# Patient Record
Sex: Male | Born: 1950 | Race: Black or African American | Hispanic: No | Smoking: Former smoker
Health system: Southern US, Community
[De-identification: ages and names within clinical notes are randomized; demographics above are authoritative.]

## PROBLEM LIST (undated history)

## (undated) DIAGNOSIS — D649 Anemia, unspecified: Secondary | ICD-10-CM

## (undated) DIAGNOSIS — E119 Type 2 diabetes mellitus without complications: Secondary | ICD-10-CM

## (undated) DIAGNOSIS — I10 Essential (primary) hypertension: Secondary | ICD-10-CM

## (undated) DIAGNOSIS — K7460 Unspecified cirrhosis of liver: Secondary | ICD-10-CM

## (undated) DIAGNOSIS — F4310 Post-traumatic stress disorder, unspecified: Secondary | ICD-10-CM

## (undated) HISTORY — PX: CHOLECYSTECTOMY: SHX55

## (undated) HISTORY — PX: BACK SURGERY: SHX140

## (undated) HISTORY — PX: ANKLE SURGERY: SHX546

---

## 2015-02-05 ENCOUNTER — Encounter (HOSPITAL_COMMUNITY): Payer: Self-pay | Admitting: *Deleted

## 2015-02-05 ENCOUNTER — Emergency Department (HOSPITAL_COMMUNITY): Payer: Medicare HMO

## 2015-02-05 ENCOUNTER — Emergency Department (HOSPITAL_COMMUNITY)
Admission: EM | Admit: 2015-02-05 | Discharge: 2015-02-05 | Disposition: A | Discharge status: Home / Self Care | Payer: Medicare HMO | Attending: Emergency Medicine | Admitting: Emergency Medicine

## 2015-02-05 DIAGNOSIS — M62830 Muscle spasm of back: Secondary | ICD-10-CM

## 2015-02-05 DIAGNOSIS — Y92410 Unspecified street and highway as the place of occurrence of the external cause: Secondary | ICD-10-CM | POA: Insufficient documentation

## 2015-02-05 DIAGNOSIS — Z87891 Personal history of nicotine dependence: Secondary | ICD-10-CM | POA: Insufficient documentation

## 2015-02-05 DIAGNOSIS — Y998 Other external cause status: Secondary | ICD-10-CM | POA: Insufficient documentation

## 2015-02-05 DIAGNOSIS — S3992XA Unspecified injury of lower back, initial encounter: Secondary | ICD-10-CM | POA: Insufficient documentation

## 2015-02-05 DIAGNOSIS — S199XXA Unspecified injury of neck, initial encounter: Secondary | ICD-10-CM | POA: Diagnosis not present

## 2015-02-05 DIAGNOSIS — S8992XA Unspecified injury of left lower leg, initial encounter: Secondary | ICD-10-CM | POA: Diagnosis not present

## 2015-02-05 DIAGNOSIS — M545 Low back pain, unspecified: Secondary | ICD-10-CM

## 2015-02-05 DIAGNOSIS — E119 Type 2 diabetes mellitus without complications: Secondary | ICD-10-CM | POA: Diagnosis not present

## 2015-02-05 DIAGNOSIS — Y9389 Activity, other specified: Secondary | ICD-10-CM | POA: Insufficient documentation

## 2015-02-05 DIAGNOSIS — I10 Essential (primary) hypertension: Secondary | ICD-10-CM | POA: Diagnosis not present

## 2015-02-05 DIAGNOSIS — M79605 Pain in left leg: Secondary | ICD-10-CM

## 2015-02-05 DIAGNOSIS — S80812A Abrasion, left lower leg, initial encounter: Secondary | ICD-10-CM | POA: Insufficient documentation

## 2015-02-05 DIAGNOSIS — Z8659 Personal history of other mental and behavioral disorders: Secondary | ICD-10-CM | POA: Insufficient documentation

## 2015-02-05 HISTORY — DX: Type 2 diabetes mellitus without complications: E11.9

## 2015-02-05 HISTORY — DX: Essential (primary) hypertension: I10

## 2015-02-05 HISTORY — DX: Post-traumatic stress disorder, unspecified: F43.10

## 2015-02-05 MED ORDER — CYCLOBENZAPRINE HCL 10 MG PO TABS: 5.0000 mg | ORAL_TABLET | Freq: Three times a day (TID) | ORAL | Status: DC | PRN

## 2015-02-05 MED ORDER — HYDROCODONE-ACETAMINOPHEN 5-325 MG PO TABS
1.0000 | ORAL_TABLET | Freq: Once | ORAL | Status: AC
  Administered 2015-02-05: 1 via ORAL
  Filled 2015-02-05: qty 1

## 2015-02-05 MED ORDER — HYDROCODONE-ACETAMINOPHEN 5-325 MG PO TABS: 1.0000 | ORAL_TABLET | Freq: Four times a day (QID) | ORAL | Status: DC | PRN

## 2015-02-05 MED ORDER — NAPROXEN 500 MG PO TABS: 500.0000 mg | ORAL_TABLET | Freq: Two times a day (BID) | ORAL | Status: DC | PRN

## 2015-02-05 NOTE — ED Notes (Signed)
Patient gone to xray 
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
## 2015-02-09 ENCOUNTER — Emergency Department (HOSPITAL_COMMUNITY): Payer: Medicare HMO

## 2015-02-09 ENCOUNTER — Encounter (HOSPITAL_COMMUNITY): Payer: Self-pay | Admitting: Emergency Medicine

## 2015-02-09 ENCOUNTER — Inpatient Hospital Stay (HOSPITAL_COMMUNITY)
Admission: EM | Admit: 2015-02-09 | Discharge: 2015-02-12 | DRG: 442 | Disposition: A | Discharge status: Home / Self Care | Payer: Medicare HMO | Attending: Internal Medicine | Admitting: Internal Medicine

## 2015-02-09 DIAGNOSIS — Z9049 Acquired absence of other specified parts of digestive tract: Secondary | ICD-10-CM | POA: Diagnosis present

## 2015-02-09 DIAGNOSIS — R4182 Altered mental status, unspecified: Secondary | ICD-10-CM

## 2015-02-09 DIAGNOSIS — R339 Retention of urine, unspecified: Secondary | ICD-10-CM | POA: Diagnosis present

## 2015-02-09 DIAGNOSIS — E119 Type 2 diabetes mellitus without complications: Secondary | ICD-10-CM | POA: Diagnosis present

## 2015-02-09 DIAGNOSIS — F4310 Post-traumatic stress disorder, unspecified: Secondary | ICD-10-CM | POA: Diagnosis present

## 2015-02-09 DIAGNOSIS — K7290 Hepatic failure, unspecified without coma: Principal | ICD-10-CM | POA: Diagnosis present

## 2015-02-09 DIAGNOSIS — D696 Thrombocytopenia, unspecified: Secondary | ICD-10-CM | POA: Diagnosis present

## 2015-02-09 DIAGNOSIS — F1290 Cannabis use, unspecified, uncomplicated: Secondary | ICD-10-CM | POA: Diagnosis present

## 2015-02-09 DIAGNOSIS — E869 Volume depletion, unspecified: Secondary | ICD-10-CM | POA: Diagnosis present

## 2015-02-09 DIAGNOSIS — K7682 Hepatic encephalopathy: Secondary | ICD-10-CM

## 2015-02-09 DIAGNOSIS — Z87891 Personal history of nicotine dependence: Secondary | ICD-10-CM

## 2015-02-09 DIAGNOSIS — I10 Essential (primary) hypertension: Secondary | ICD-10-CM | POA: Diagnosis present

## 2015-02-09 DIAGNOSIS — E7220 Disorder of urea cycle metabolism, unspecified: Secondary | ICD-10-CM | POA: Diagnosis present

## 2015-02-09 DIAGNOSIS — N179 Acute kidney failure, unspecified: Secondary | ICD-10-CM

## 2015-02-09 DIAGNOSIS — K7460 Unspecified cirrhosis of liver: Secondary | ICD-10-CM

## 2015-02-09 DIAGNOSIS — D649 Anemia, unspecified: Secondary | ICD-10-CM

## 2015-02-09 HISTORY — DX: Anemia, unspecified: D64.9

## 2015-02-09 HISTORY — DX: Unspecified cirrhosis of liver: K74.60

## 2015-02-09 LAB — COMPREHENSIVE METABOLIC PANEL
ALT: 30 U/L (ref 17–63)
ANION GAP: 9 (ref 5–15)
AST: 84 U/L — ABNORMAL HIGH (ref 15–41)
Albumin: 3 g/dL — ABNORMAL LOW (ref 3.5–5.0)
Alkaline Phosphatase: 251 U/L — ABNORMAL HIGH (ref 38–126)
BUN: 41 mg/dL — ABNORMAL HIGH (ref 6–20)
CO2: 21 mmol/L — AB (ref 22–32)
CREATININE: 2.04 mg/dL — AB (ref 0.61–1.24)
Calcium: 9.5 mg/dL (ref 8.9–10.3)
Chloride: 108 mmol/L (ref 101–111)
GFR calc Af Amer: 38 mL/min — ABNORMAL LOW (ref >60)
GFR calc non Af Amer: 33 mL/min — ABNORMAL LOW (ref >60)
GLUCOSE: 114 mg/dL — AB (ref 65–99)
Potassium: 4.9 mmol/L (ref 3.5–5.1)
Sodium: 138 mmol/L (ref 135–145)
Total Bilirubin: 1.3 mg/dL — ABNORMAL HIGH (ref 0.3–1.2)
Total Protein: 8.2 g/dL — ABNORMAL HIGH (ref 6.5–8.1)

## 2015-02-09 LAB — RAPID URINE DRUG SCREEN, HOSP PERFORMED
Amphetamines: NOT DETECTED
BARBITURATES: NOT DETECTED
Benzodiazepines: NOT DETECTED
COCAINE: NOT DETECTED
Opiates: NOT DETECTED
TETRAHYDROCANNABINOL: NOT DETECTED

## 2015-02-09 LAB — URINALYSIS, ROUTINE W REFLEX MICROSCOPIC
Bilirubin Urine: NEGATIVE
Glucose, UA: NEGATIVE mg/dL
Hgb urine dipstick: NEGATIVE
KETONES UR: NEGATIVE mg/dL
Leukocytes, UA: NEGATIVE
Nitrite: NEGATIVE
PH: 7.5 (ref 5.0–8.0)
Protein, ur: NEGATIVE mg/dL
SPECIFIC GRAVITY, URINE: 1.014 (ref 1.005–1.030)
Urobilinogen, UA: 4 mg/dL — ABNORMAL HIGH (ref 0.0–1.0)

## 2015-02-09 LAB — CBG MONITORING, ED: GLUCOSE-CAPILLARY: 87 mg/dL (ref 65–99)

## 2015-02-09 LAB — ETHANOL: Alcohol, Ethyl (B): <5 mg/dL (ref <5)

## 2015-02-09 LAB — AMMONIA: Ammonia: 181 umol/L — ABNORMAL HIGH (ref 9–35)

## 2015-02-09 MED ORDER — NALOXONE HCL 0.4 MG/ML IJ SOLN
0.4000 mg | Freq: Once | INTRAMUSCULAR | Status: AC
  Administered 2015-02-09: 0.4 mg via INTRAVENOUS
  Filled 2015-02-09: qty 1
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
## 2015-02-10 ENCOUNTER — Inpatient Hospital Stay (HOSPITAL_COMMUNITY): Payer: Medicare HMO

## 2015-02-10 ENCOUNTER — Encounter (HOSPITAL_COMMUNITY): Payer: Self-pay | Admitting: Internal Medicine

## 2015-02-10 DIAGNOSIS — R4182 Altered mental status, unspecified: Secondary | ICD-10-CM

## 2015-02-10 DIAGNOSIS — K7290 Hepatic failure, unspecified without coma: Secondary | ICD-10-CM | POA: Diagnosis present

## 2015-02-10 DIAGNOSIS — E119 Type 2 diabetes mellitus without complications: Secondary | ICD-10-CM | POA: Diagnosis present

## 2015-02-10 DIAGNOSIS — D649 Anemia, unspecified: Secondary | ICD-10-CM | POA: Diagnosis not present

## 2015-02-10 DIAGNOSIS — D696 Thrombocytopenia, unspecified: Secondary | ICD-10-CM | POA: Diagnosis present

## 2015-02-10 DIAGNOSIS — R339 Retention of urine, unspecified: Secondary | ICD-10-CM | POA: Diagnosis present

## 2015-02-10 DIAGNOSIS — Z87891 Personal history of nicotine dependence: Secondary | ICD-10-CM | POA: Diagnosis not present

## 2015-02-10 DIAGNOSIS — E869 Volume depletion, unspecified: Secondary | ICD-10-CM | POA: Diagnosis present

## 2015-02-10 DIAGNOSIS — K7460 Unspecified cirrhosis of liver: Secondary | ICD-10-CM | POA: Diagnosis present

## 2015-02-10 DIAGNOSIS — N179 Acute kidney failure, unspecified: Secondary | ICD-10-CM | POA: Diagnosis present

## 2015-02-10 DIAGNOSIS — E7220 Disorder of urea cycle metabolism, unspecified: Secondary | ICD-10-CM | POA: Diagnosis present

## 2015-02-10 DIAGNOSIS — Z9049 Acquired absence of other specified parts of digestive tract: Secondary | ICD-10-CM | POA: Diagnosis present

## 2015-02-10 DIAGNOSIS — F1290 Cannabis use, unspecified, uncomplicated: Secondary | ICD-10-CM | POA: Diagnosis present

## 2015-02-10 DIAGNOSIS — I10 Essential (primary) hypertension: Secondary | ICD-10-CM | POA: Diagnosis present

## 2015-02-10 DIAGNOSIS — F4310 Post-traumatic stress disorder, unspecified: Secondary | ICD-10-CM | POA: Diagnosis present

## 2015-02-10 LAB — COMPREHENSIVE METABOLIC PANEL
ALBUMIN: 2.9 g/dL — AB (ref 3.5–5.0)
ALT: 30 U/L (ref 17–63)
AST: 80 U/L — ABNORMAL HIGH (ref 15–41)
Alkaline Phosphatase: 222 U/L — ABNORMAL HIGH (ref 38–126)
Anion gap: 12 (ref 5–15)
BUN: 35 mg/dL — AB (ref 6–20)
CHLORIDE: 110 mmol/L (ref 101–111)
CO2: 17 mmol/L — ABNORMAL LOW (ref 22–32)
Calcium: 9.4 mg/dL (ref 8.9–10.3)
Creatinine, Ser: 1.78 mg/dL — ABNORMAL HIGH (ref 0.61–1.24)
GFR, EST AFRICAN AMERICAN: 45 mL/min — AB (ref >60)
GFR, EST NON AFRICAN AMERICAN: 39 mL/min — AB (ref >60)
Glucose, Bld: 125 mg/dL — ABNORMAL HIGH (ref 65–99)
Potassium: 4.4 mmol/L (ref 3.5–5.1)
Sodium: 139 mmol/L (ref 135–145)
Total Bilirubin: 1.7 mg/dL — ABNORMAL HIGH (ref 0.3–1.2)
Total Protein: 8.2 g/dL — ABNORMAL HIGH (ref 6.5–8.1)

## 2015-02-10 LAB — AMMONIA: Ammonia: 90 umol/L — ABNORMAL HIGH (ref 9–35)

## 2015-02-10 LAB — GLUCOSE, CAPILLARY
GLUCOSE-CAPILLARY: 103 mg/dL — AB (ref 65–99)
GLUCOSE-CAPILLARY: 117 mg/dL — AB (ref 65–99)
GLUCOSE-CAPILLARY: 82 mg/dL (ref 65–99)
Glucose-Capillary: 111 mg/dL — ABNORMAL HIGH (ref 65–99)
Glucose-Capillary: 122 mg/dL — ABNORMAL HIGH (ref 65–99)

## 2015-02-10 LAB — CBC WITH DIFFERENTIAL/PLATELET
BASOS PCT: 0 % (ref 0–1)
Basophils Absolute: 0 10*3/uL (ref 0.0–0.1)
EOS PCT: 1 % (ref 0–5)
Eosinophils Absolute: 0.1 10*3/uL (ref 0.0–0.7)
HCT: 33.3 % — ABNORMAL LOW (ref 39.0–52.0)
HEMOGLOBIN: 11.7 g/dL — AB (ref 13.0–17.0)
LYMPHS PCT: 27 % (ref 12–46)
Lymphs Abs: 1.6 10*3/uL (ref 0.7–4.0)
MCH: 32.5 pg (ref 26.0–34.0)
MCHC: 35.1 g/dL (ref 30.0–36.0)
MCV: 92.5 fL (ref 78.0–100.0)
Monocytes Absolute: 1 10*3/uL (ref 0.1–1.0)
Monocytes Relative: 17 % — ABNORMAL HIGH (ref 3–12)
NEUTROS PCT: 55 % (ref 43–77)
Neutro Abs: 3.2 10*3/uL (ref 1.7–7.7)
Platelets: 106 10*3/uL — ABNORMAL LOW (ref 150–400)
RBC: 3.6 MIL/uL — ABNORMAL LOW (ref 4.22–5.81)
RDW: 13.5 % (ref 11.5–15.5)
WBC: 5.9 10*3/uL (ref 4.0–10.5)

## 2015-02-10 LAB — CBC
HEMATOCRIT: 34.3 % — AB (ref 39.0–52.0)
Hemoglobin: 12 g/dL — ABNORMAL LOW (ref 13.0–17.0)
MCH: 32.4 pg (ref 26.0–34.0)
MCHC: 35 g/dL (ref 30.0–36.0)
MCV: 92.7 fL (ref 78.0–100.0)
Platelets: 101 10*3/uL — ABNORMAL LOW (ref 150–400)
RBC: 3.7 MIL/uL — ABNORMAL LOW (ref 4.22–5.81)
RDW: 13.8 % (ref 11.5–15.5)
WBC: 6 10*3/uL (ref 4.0–10.5)

## 2015-02-10 LAB — TSH: TSH: 1.342 u[IU]/mL (ref 0.350–4.500)

## 2015-02-10 LAB — PROTIME-INR
INR: 1.36 (ref 0.00–1.49)
Prothrombin Time: 16.8 seconds — ABNORMAL HIGH (ref 11.6–15.2)

## 2015-02-10 LAB — SODIUM, URINE, RANDOM: Sodium, Ur: 144 mmol/L

## 2015-02-10 LAB — MRSA PCR SCREENING: MRSA by PCR: NEGATIVE

## 2015-02-10 MED ORDER — LACTULOSE 10 GM/15ML PO SOLN
20.0000 g | ORAL | Status: DC
  Administered 2015-02-10 – 2015-02-12 (×12): 20 g via ORAL
  Filled 2015-02-10 (×18): qty 30

## 2015-02-10 MED ORDER — LACTULOSE ENEMA
300.0000 mL | Freq: Once | ORAL | Status: AC
  Administered 2015-02-10: 300 mL via RECTAL
  Filled 2015-02-10: qty 300

## 2015-02-10 MED ORDER — LACTULOSE 10 GM/15ML PO SOLN
20.0000 g | ORAL | Status: DC
  Filled 2015-02-10 (×7): qty 30

## 2015-02-10 MED ORDER — LABETALOL HCL 5 MG/ML IV SOLN
10.0000 mg | Freq: Once | INTRAVENOUS | Status: AC
  Administered 2015-02-10: 10 mg via INTRAVENOUS
  Filled 2015-02-10: qty 4

## 2015-02-10 MED ORDER — SODIUM CHLORIDE 0.9 % IV SOLN
INTRAVENOUS | Status: AC
  Administered 2015-02-10 (×3): via INTRAVENOUS

## 2015-02-10 MED ORDER — AMLODIPINE BESYLATE 10 MG PO TABS
10.0000 mg | ORAL_TABLET | Freq: Every day | ORAL | Status: DC
  Administered 2015-02-10 – 2015-02-12 (×3): 10 mg via ORAL
  Filled 2015-02-10 (×3): qty 1

## 2015-02-10 MED ORDER — GABAPENTIN 300 MG PO CAPS
300.0000 mg | ORAL_CAPSULE | Freq: Two times a day (BID) | ORAL | Status: DC
Start: 2015-02-10 — End: 2015-02-12
  Administered 2015-02-10 – 2015-02-12 (×5): 300 mg via ORAL
  Filled 2015-02-10 (×6): qty 1

## 2015-02-10 MED ORDER — TRAMADOL HCL 50 MG PO TABS
50.0000 mg | ORAL_TABLET | Freq: Four times a day (QID) | ORAL | Status: DC | PRN
  Administered 2015-02-10 – 2015-02-12 (×3): 50 mg via ORAL
  Filled 2015-02-10 (×3): qty 1

## 2015-02-10 MED ORDER — TRAZODONE HCL 50 MG PO TABS
50.0000 mg | ORAL_TABLET | Freq: Every day | ORAL | Status: DC
  Administered 2015-02-10 – 2015-02-11 (×2): 50 mg via ORAL
  Filled 2015-02-10 (×3): qty 1

## 2015-02-10 MED ORDER — INSULIN ASPART 100 UNIT/ML ~~LOC~~ SOLN
0.0000 [IU] | Freq: Three times a day (TID) | SUBCUTANEOUS | Status: DC
  Administered 2015-02-11: 1 [IU] via SUBCUTANEOUS
  Administered 2015-02-11: 2 [IU] via SUBCUTANEOUS
  Administered 2015-02-11: 1 [IU] via SUBCUTANEOUS
  Administered 2015-02-12: 5 [IU] via SUBCUTANEOUS
  Administered 2015-02-12: 1 [IU] via SUBCUTANEOUS

## 2015-02-10 MED ORDER — HYPROMELLOSE (GONIOSCOPIC) 2.5 % OP SOLN: 1.0000 [drp] | Freq: Four times a day (QID) | OPHTHALMIC | Status: DC | PRN

## 2015-02-10 MED ORDER — SODIUM CHLORIDE 0.9 % IJ SOLN
3.0000 mL | Freq: Two times a day (BID) | INTRAMUSCULAR | Status: DC
  Administered 2015-02-10 – 2015-02-12 (×4): 3 mL via INTRAVENOUS

## 2015-02-10 MED ORDER — SERTRALINE HCL 25 MG PO TABS
25.0000 mg | ORAL_TABLET | Freq: Every day | ORAL | Status: DC
Start: 2015-02-10 — End: 2015-02-12
  Administered 2015-02-10 – 2015-02-12 (×3): 25 mg via ORAL
  Filled 2015-02-10 (×3): qty 1

## 2015-02-10 MED ORDER — HYDRALAZINE HCL 20 MG/ML IJ SOLN
10.0000 mg | Freq: Four times a day (QID) | INTRAMUSCULAR | Status: DC | PRN
  Administered 2015-02-10 – 2015-02-11 (×4): 10 mg via INTRAVENOUS
  Filled 2015-02-10 (×4): qty 1

## 2015-02-10 MED ORDER — LACTULOSE ENEMA
300.0000 mL | Freq: Once | RECTAL | Status: AC
Start: 2015-02-10 — End: 2015-02-10
  Administered 2015-02-10: 300 mL via RECTAL
  Filled 2015-02-10: qty 300

## 2015-02-10 NOTE — Progress Notes (Signed)
Pt complains of headache. Craige CottaKirby NP paged. Pt also complains of blurred vision, which he stated might be related to him not having his glasses from home.  Craige CottaKirby NP placed order for Tylenol 650mg  PO. Will carry out order.

## 2015-02-10 NOTE — Progress Notes (Signed)
Patient ok to travel to ultrasound off telemetry and without RN per MD.

## 2015-02-10 NOTE — Progress Notes (Signed)
NURSING PROGRESS NOTE  Jermaine Alexander [id number] Transfer Data: 02/10/2015 5:57 PM Attending Provider: Leatha Gildingostin M Gherghe, MD WGN:FAOZHYQMPCP:PROVIDER NOT IN SYSTEM Code Status: FULL  Jermaine Alexander is a 64 y.o. male patient transferred from 23M, alert and oriented x4. -No acute distress noted.  -No complaints of shortness of breath.  -No complaints of chest pain.   Cardiac Monitoring:   Blood pressure 153/67, pulse 95, temperature 98.8 F (37.1 C), temperature source Oral, resp. rate 18, height 6\' 2"  (1.88 m), weight 84.687 kg (186 lb 11.2 oz), SpO2 100 %.    Allergies:  Review of patient's allergies indicates no known allergies.  Past Medical History:   has a past medical history of Diabetes mellitus without complication; Hypertension; PTSD (post-traumatic stress disorder); Cirrhosis; and Anemia.  Past Surgical History:   has past surgical history that includes Cholecystectomy; Ankle surgery; and Back surgery.  Social History:   reports that he has quit smoking. He does not have any smokeless tobacco history on file. He reports that he uses illicit drugs (Marijuana). He reports that he does not drink alcohol.  Skin: intact  Patient/Family orientated to room. Information packet given to patient/family. Admission inpatient armband information verified with patient/family to include name and date of birth and placed on patient arm. Side rails up x 2, fall assessment and education completed with patient/family. Patient/family able to verbalize understanding of risk associated with falls and verbalized understanding to call for assistance before getting out of bed. Call light within reach. Patient/family able to voice and demonstrate understanding of unit orientation instructions.    Will continue to evaluate and treat per MD orders.

## 2015-02-10 NOTE — Care Management Note (Addendum)
Case Management Note  Patient Details  Name: Jermaine Alexander MRN: [id number] Date of Birth: May 18, 1951  Subjective/Objective:      [place] now living with his daughter Jermaine Alexander.  [facility] [place]  [facility] and to call back to see if I need to notify the [facility] with numbers to call.  Jermaine Alexander's number is [phone]  [facility] called back - patient is not service related and has [employer] insurance primary and Medicare part C and D, which covers his hospitalization here.  Is a patient of [facility].  Dr.  Alphonsa GinVaidya, SW Jermaine Alexander at [phone], ext [phone].                Action/Plan:   Expected Discharge Date:                  Expected Discharge Plan:  Home/Self Care  In-House Referral:     Discharge planning Services     Post Acute Care Choice:    Choice offered to:     DME Arranged:    DME Agency:     HH Arranged:    HH Agency:     Status of Service:  In process, will continue to follow  Medicare Important Message Given:    Date Medicare IM Given:    Medicare IM give by:    Date Additional Medicare IM Given:    Additional Medicare Important Message give by:     If discussed at Long Length of Stay Meetings, dates discussed:    Additional Comments:  Vangie BickerBrown, Tekelia Kareem Jane, RN 02/10/2015, 2:33 PM

## 2015-02-10 NOTE — Progress Notes (Signed)
Patient seen and examined this morning, admitted overnight with hepatic encephalopathy. 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  He wakes up when prompted, appears sleepy, is oriented to person   Hepatic encephalopathy - I am not sure whether this is his first episode or not, does not appear to be on Lactulose or Rifaximin at home - lactulose enema x 3 so far, waking up more, will continue po - abdominal US without ascites  Liver cirrhosis - MELD 17 this morning - decompensated with evidence of HE  Elevated LFTS - due to cirrhosis  Thrombocytopenia / anemia - in the setting of cirrhosis  AKI - unknown baseline renal function - no evidence of fluid overload, no ascites on exam - hold Lisinopril, Naproxen, Metformin, HCTZ, Lasix  DM - obtain A1C - hold metformin - SSI  Improving mental status, transfer to floor  Costin M. Elvera LennoxGherghe, MD [facility] [phone]

## 2015-02-10 NOTE — H&P (Signed)
Jermaine Alexander is an 64 y.o. male.   Chief Complaint: altered mental status HPI: 64 yo male with dm2, htn, ptsd, cirrhosis apparently c/o ams since Saturday.  Pt is alert, but not oriented to place or time.  Just person.  Pt was brought to ED today and found to be in ARF, and to have probable hepatic encephalopathy.  CT brain negative.  Pt will be admitted for AMS secondary to hepatic encephalopathy and ARF.   Past Medical History  Diagnosis Date  . Diabetes mellitus without complication   . Hypertension   . PTSD (post-traumatic stress disorder)   . Cirrhosis   . Anemia     Past Surgical History  Procedure Laterality Date  . Cholecystectomy    . Ankle surgery    . Back surgery      History reviewed. No pertinent family history. Social History:  reports that he has quit smoking. He does not have any smokeless tobacco history on file. He reports that he uses illicit drugs (Marijuana). He reports that he does not drink alcohol.  Allergies: No Known Allergies Medications reviewed   Results for orders placed or performed during the hospital encounter of 02/09/15 (from the past 48 hour(s))  CBG monitoring, ED     Status: None   Collection Time: 02/09/15 10:06 PM  Result Value Ref Range   Glucose-Capillary 87 65 - 99 mg/dL  Urinalysis, Routine w reflex microscopic     Status: Abnormal   Collection Time: 02/09/15 10:46 PM  Result Value Ref Range   Color, Urine YELLOW YELLOW   APPearance CLEAR CLEAR   Specific Gravity, Urine 1.014 1.005 - 1.030   pH 7.5 5.0 - 8.0   Glucose, UA NEGATIVE NEGATIVE mg/dL   Hgb urine dipstick NEGATIVE NEGATIVE   Bilirubin Urine NEGATIVE NEGATIVE   Ketones, ur NEGATIVE NEGATIVE mg/dL   Protein, ur NEGATIVE NEGATIVE mg/dL   Urobilinogen, UA 4.0 (H) 0.0 - 1.0 mg/dL   Nitrite NEGATIVE NEGATIVE   Leukocytes, UA NEGATIVE NEGATIVE    Comment: MICROSCOPIC NOT DONE ON URINES WITH NEGATIVE PROTEIN, BLOOD, LEUKOCYTES, NITRITE, OR GLUCOSE <1000 mg/dL.  Urine rapid  drug screen (hosp performed)not at [facility]     Status: None   Collection Time: 02/09/15 10:46 PM  Result Value Ref Range   Opiates NONE DETECTED NONE DETECTED   Cocaine NONE DETECTED NONE DETECTED   Benzodiazepines NONE DETECTED NONE DETECTED   Amphetamines NONE DETECTED NONE DETECTED   Tetrahydrocannabinol NONE DETECTED NONE DETECTED   Barbiturates NONE DETECTED NONE DETECTED    Comment:        DRUG SCREEN FOR MEDICAL PURPOSES ONLY.  IF CONFIRMATION IS NEEDED FOR ANY PURPOSE, NOTIFY LAB WITHIN 5 DAYS.        LOWEST DETECTABLE LIMITS FOR URINE DRUG SCREEN Drug Class       Cutoff (ng/mL) Amphetamine      1000 Barbiturate      200 Benzodiazepine   765 Tricyclics       465 Opiates          300 Cocaine          300 THC              50   Comprehensive metabolic panel     Status: Abnormal   Collection Time: 02/09/15 10:55 PM  Result Value Ref Range   Sodium 138 135 - 145 mmol/L   Potassium 4.9 3.5 - 5.1 mmol/L   Chloride 108 101 - 111 mmol/L  CO2 21 (L) 22 - 32 mmol/L   Glucose, Bld 114 (H) 65 - 99 mg/dL   BUN 41 (H) 6 - 20 mg/dL   Creatinine, Ser 2.04 (H) 0.61 - 1.24 mg/dL   Calcium 9.5 8.9 - 10.3 mg/dL   Total Protein 8.2 (H) 6.5 - 8.1 g/dL   Albumin 3.0 (L) 3.5 - 5.0 g/dL   AST 84 (H) 15 - 41 U/L   ALT 30 17 - 63 U/L   Alkaline Phosphatase 251 (H) 38 - 126 U/L   Total Bilirubin 1.3 (H) 0.3 - 1.2 mg/dL   GFR calc non Af Amer 33 (L) >60 mL/min   GFR calc Af Amer 38 (L) >60 mL/min    Comment: (NOTE) The eGFR has been calculated using the CKD EPI equation. This calculation has not been validated in all clinical situations. eGFR's persistently <60 mL/min signify possible Chronic Kidney Disease.    Anion gap 9 5 - 15  Ethanol     Status: None   Collection Time: 02/09/15 10:55 PM  Result Value Ref Range   Alcohol, Ethyl (B) <5 <5 mg/dL    Comment:        LOWEST DETECTABLE LIMIT FOR SERUM ALCOHOL IS 11 mg/dL FOR MEDICAL PURPOSES ONLY   Ammonia     Status: Abnormal    Collection Time: 02/09/15 10:55 PM  Result Value Ref Range   Ammonia 181 (H) 9 - 35 umol/L  CBC     Status: Abnormal   Collection Time: 02/09/15 11:58 PM  Result Value Ref Range   WBC 6.0 4.0 - 10.5 K/uL   RBC 3.70 (L) 4.22 - 5.81 MIL/uL   Hemoglobin 12.0 (L) 13.0 - 17.0 g/dL   HCT 34.3 (L) 39.0 - 52.0 %   MCV 92.7 78.0 - 100.0 fL   MCH 32.4 26.0 - 34.0 pg   MCHC 35.0 30.0 - 36.0 g/dL   RDW 13.8 11.5 - 15.5 %   Platelets 101 (L) 150 - 400 K/uL    Comment: PLATELET COUNT CONFIRMED BY SMEAR SPECIMEN CHECKED FOR CLOTS REPEATED TO VERIFY    Ct Head Wo Contrast  02/09/2015   CLINICAL DATA:  Found lethargic and unresponsive.  EXAM: CT HEAD WITHOUT CONTRAST  TECHNIQUE: Contiguous axial images were obtained from the base of the skull through the vertex without intravenous contrast.  COMPARISON:  None.  FINDINGS: The patient was unable to remain motionless for the exam. Small or subtle lesions could be overlooked.  No evidence for acute infarction, hemorrhage, mass lesion, hydrocephalus, or extra-axial fluid. No atrophy or white matter disease. Intact calvarium. No acute sinus or mastoid disease.  IMPRESSION: Motion degraded exam demonstrating no definite acute intracranial abnormality   Electronically Signed   By: Rolla Flatten M.D.   On: 02/09/2015 23:33   Dg Chest Portable 1 View  02/09/2015   CLINICAL DATA:  Altered mental status with hypoglycemia. Minimally responsive.  EXAM: PORTABLE CHEST - 1 VIEW  COMPARISON:  None.  FINDINGS: Low lung volumes. Suspected mild cardiomegaly. Bibasilar subsegmental atelectasis. No definite infiltrates or failure. Osteopenia.  IMPRESSION: Low lung volumes with mild cardiomegaly and subsegmental atelectasis. No overt failure or infiltrates.   Electronically Signed   By: Rolla Flatten M.D.   On: 02/09/2015 23:31    Review of Systems  Constitutional: Negative.   HENT: Negative.   Eyes: Negative.   Respiratory: Negative.   Cardiovascular: Negative.    Gastrointestinal: Negative.   Genitourinary: Negative.   Musculoskeletal: Negative.  Skin: Negative.   Neurological: Positive for tingling and sensory change. Negative for dizziness, tremors, speech change, focal weakness, seizures and loss of consciousness.       Shaking, altered mental status  Endo/Heme/Allergies: Negative.   Psychiatric/Behavioral: Positive for depression. Negative for suicidal ideas, hallucinations, memory loss and substance abuse. The patient is not nervous/anxious and does not have insomnia.     Blood pressure 160/81, pulse 87, temperature 98.9 F (37.2 C), temperature source Rectal, resp. rate 16, height 6' (1.829 m), weight 99.791 kg (220 lb), SpO2 100 %. Physical Exam  Constitutional: He appears well-developed and well-nourished.  HENT:  Head: Normocephalic and atraumatic.  Mouth/Throat: No oropharyngeal exudate.  Eyes: Conjunctivae and EOM are normal. Pupils are equal, round, and reactive to light. No scleral icterus.  Neck: Normal range of motion. Neck supple. No JVD present. No tracheal deviation present. No thyromegaly present.  Cardiovascular: Normal rate and regular rhythm.  Exam reveals no gallop and no friction rub.   No murmur heard. Respiratory: Effort normal and breath sounds normal. No respiratory distress. He has no wheezes. He has no rales.  GI: Soft. Bowel sounds are normal. He exhibits no distension. There is no tenderness. There is no rebound and no guarding.  Musculoskeletal: Normal range of motion. He exhibits no edema or tenderness.  Lymphadenopathy:    He has no cervical adenopathy.  Neurological: He is alert. He has normal reflexes. He displays normal reflexes. No cranial nerve deficit. He exhibits normal muscle tone. Coordination normal.  Skin: Skin is dry. No rash noted. No erythema. No pallor.  Psychiatric: He has a normal mood and affect. His behavior is normal. Judgment and thought content normal.     Assessment/Plan AMS  secondary to hyperammonemia ? Due to recent addition of muscle relaxer,  Lactulose  ARF ? Naproxen Check urine sodium, urine creatinine, urine eosinophils Check renal ultrasound Hydrate with normal saline  ? Lactic acidosis secondary to metformin and ARF Stop metformin  Dm2 Stop metformin Stop all medication fsbs q4h iss  Anemia Check cbc in am  Thrombocytopenia Check cbc in am  Abnormal lft Check acute hepatitis panel Check US  DVT porphylasix:  scd  Jani Gravel 02/10/2015, 12:55 AM

## 2015-02-10 NOTE — Progress Notes (Signed)
[facility] RN for transfer to [phone]

## 2015-02-10 NOTE — Progress Notes (Signed)
Paged on call MD. Last documented void was 02/09/15 at 2200. Bladder scan done at 0400 and showed >700cc. Pt has altered mental status and unable to void. MD notified. New orders received. Will continue to monitor.

## 2015-02-11 LAB — COMPREHENSIVE METABOLIC PANEL
ALK PHOS: 153 U/L — AB (ref 38–126)
ALT: 29 U/L (ref 17–63)
AST: 75 U/L — AB (ref 15–41)
Albumin: 2.5 g/dL — ABNORMAL LOW (ref 3.5–5.0)
Anion gap: 6 (ref 5–15)
BILIRUBIN TOTAL: 1.4 mg/dL — AB (ref 0.3–1.2)
BUN: 20 mg/dL (ref 6–20)
CALCIUM: 8.7 mg/dL — AB (ref 8.9–10.3)
CO2: 19 mmol/L — ABNORMAL LOW (ref 22–32)
CREATININE: 1.2 mg/dL (ref 0.61–1.24)
Chloride: 113 mmol/L — ABNORMAL HIGH (ref 101–111)
GFR calc Af Amer: >60 mL/min (ref >60)
GFR calc non Af Amer: >60 mL/min (ref >60)
Glucose, Bld: 147 mg/dL — ABNORMAL HIGH (ref 65–99)
Potassium: 3.8 mmol/L (ref 3.5–5.1)
Sodium: 138 mmol/L (ref 135–145)
Total Protein: 7.3 g/dL (ref 6.5–8.1)

## 2015-02-11 LAB — CBC
HEMATOCRIT: 30.5 % — AB (ref 39.0–52.0)
HEMOGLOBIN: 10.9 g/dL — AB (ref 13.0–17.0)
MCH: 32.8 pg (ref 26.0–34.0)
MCHC: 35.7 g/dL (ref 30.0–36.0)
MCV: 91.9 fL (ref 78.0–100.0)
PLATELETS: 102 10*3/uL — AB (ref 150–400)
RBC: 3.32 MIL/uL — ABNORMAL LOW (ref 4.22–5.81)
RDW: 13.7 % (ref 11.5–15.5)
WBC: 7.7 10*3/uL (ref 4.0–10.5)

## 2015-02-11 LAB — PROTIME-INR
INR: 1.47 (ref 0.00–1.49)
Prothrombin Time: 17.9 seconds — ABNORMAL HIGH (ref 11.6–15.2)

## 2015-02-11 LAB — AMMONIA: Ammonia: 55 umol/L — ABNORMAL HIGH (ref 9–35)

## 2015-02-11 LAB — HEPATITIS PANEL, ACUTE
HEP B C IGM: NEGATIVE
Hep A IgM: NEGATIVE
Hepatitis B Surface Ag: NEGATIVE

## 2015-02-11 LAB — GLUCOSE, CAPILLARY
Glucose-Capillary: 128 mg/dL — ABNORMAL HIGH (ref 65–99)
Glucose-Capillary: 140 mg/dL — ABNORMAL HIGH (ref 65–99)
Glucose-Capillary: 166 mg/dL — ABNORMAL HIGH (ref 65–99)
Glucose-Capillary: 170 mg/dL — ABNORMAL HIGH (ref 65–99)

## 2015-02-11 LAB — CALCIUM / CREATININE RATIO, URINE
CALCIUM UR: 6.1 mg/dL
CALCIUM/CREAT. RATIO: 94 mg/g{creat} (ref 0–260)
Creatinine, Urine: 64.7 mg/dL

## 2015-02-11 LAB — PHOSPHORUS: PHOSPHORUS: 2.3 mg/dL — AB (ref 2.5–4.6)

## 2015-02-11 LAB — MAGNESIUM: Magnesium: 1.8 mg/dL (ref 1.7–2.4)

## 2015-02-11 MED ORDER — ACETAMINOPHEN 325 MG PO TABS
650.0000 mg | ORAL_TABLET | Freq: Once | ORAL | Status: AC
  Administered 2015-02-11: 650 mg via ORAL
  Filled 2015-02-11: qty 2

## 2015-02-11 NOTE — Progress Notes (Addendum)
PROGRESS NOTE  Jermaine Alexander EAV:[id number] DOB: 03-15-1951 DOA: 02/09/2015 PCP: PROVIDER NOT IN SYSTEM  Subjective / 24 H Interval events - NAD, Ax0x3 this morning, no complaints - appreciates significant improvement in how he feels - does not remember much in the past 2 days - feels close to his normal self  HPI: 64 yo M who is getting most of his care at [facility] was admitted on 6/6 with AMS in the setting of elevated ammonia level, patient with known cirrhosis, c/w hepatic encephalopathy  Assessment/Plan: Active Problems:   Hyperammonemia   Altered mental status   Acute renal failure   Anemia   Cirrhosis  Hepatic encephalopathy - patient reports that he thinks may have been encephalopathic in the past once but he is not sure - lactulose enema x 3 on admission, transitioned to po yesterday, improving, ammonia down to 50 from 181 on admission, close to baseline. - abdominal US without ascites - as mental status improved, PT to evaluate - will need long term Lactulose, or Rifaximin if [facility] covers.  Liver cirrhosis - decompensated with evidence of HE - patient reports no history of ascites, GI bleed and is not sure about prior HE episodes.  - due to HCV and ETOH, followed at [facility]  Elevated LFTS - due to cirrhosis, bilirubin improving  Thrombocytopenia / anemia - in the setting of cirrhosis, stable  AKI - unknown baseline renal function - no evidence of fluid overload, no ascites on exam - held Lisinopril, Naproxen, Metformin, HCTZ, Lasix on admission - reports intermittent LE swelling at home, suspect that his renal failure was multifactorial due to medications/dehydration.  - likely hold HCTZ, Lisinopril, Naproxen on d/c but continue Lasix and Metformin, will need close follow up in [place]  DM - obtain A1C, in process - hold metformin - SSI  Diet: Diet regular Room service appropriate?: Yes; Fluid consistency:: Thin Fluids: none DVT Prophylaxis: SCD  Code Status: Full  Code Family Communication: no family bedside  Disposition Plan: home 1-2 days  Consultants:  None   Procedures:  None    Antibiotics  Anti-infectives    None       Studies  Ct Head Wo Contrast 02/09/2015 Motion degraded exam demonstrating no definite acute intracranial abnormality    US Abdomen Complete 02/10/2015 Mildly coarsened liver echotexture with subtle nodularity in areas along the liver surface suggesting given history of cirrhosis.  No hydronephrosis.  Prior cholecystectomy.   Dg Chest Portable 1 View 02/09/2015 Low lung volumes with mild cardiomegaly and subsegmental atelectasis No overt failure or infiltrates.    Objective  Filed Vitals:   02/10/15 2118 02/10/15 2235 02/11/15 0524 02/11/15 1058  BP: 169/72 158/65 149/55 165/78  Pulse: 100 102 96 87  Temp: 98.7 F (37.1 C)  98.5 F (36.9 C) 98.7 F (37.1 C)  TempSrc: Oral  Oral Oral  Resp: Height:      Weight:   84.414 kg (186 lb 1.6 oz)   SpO2: 100%  99% 100%    Intake/Output Summary (Last 24 hours) at 02/11/15 1411 Last data filed at 02/11/15 1410  Gross per 24 hour  Intake   1315 ml  Output   1075 ml  Net    240 ml   Filed Weights   02/10/15 0327 02/10/15 1753 02/11/15 0524  Weight: 87.4 kg (192 lb 10.9 oz) 84.687 kg (186 lb 11.2 oz) 84.414 kg (186 lb 1.6 oz)    Exam:  General:  NAD,  pleasant  HEENT: no scleral icterus, PERRL  Cardiovascular: RRR without MRG, 2+ peripheral pulses, no edema  Respiratory: CTA biL, good air movement, no wheezing, no crackles, no rales  Abdomen: soft, non tender, BS +, no guarding  MSK/Extremities: no clubbing/cyanosis, no joint swelling  Skin: no rashes  Neuro: non focal, strength 5/5 in all 4   Data Reviewed: Basic Metabolic Panel:  Recent Labs Lab 02/09/15 2255 02/10/15 0540 02/11/15 0530  NA 138 139 138  K 4.9 4.4 3.8  CL 108 110 113*  CO2 21* 17* 19*  GLUCOSE 114* 125* 147*  BUN 41* 35* 20  CREATININE 2.04* 1.78* 1.20   CALCIUM 9.5 9.4 8.7*  MG  --   --  1.8  PHOS  --   --  2.3*   Liver Function Tests:  Recent Labs Lab 02/09/15 2255 02/10/15 0540 02/11/15 0530  AST 84* 80* 75*  ALT 30 30 29   ALKPHOS 251* 222* 153*  BILITOT 1.3* 1.7* 1.4*  PROT 8.2* 8.2* 7.3  ALBUMIN 3.0* 2.9* 2.5*    Recent Labs Lab 02/09/15 2255 02/10/15 0540 02/11/15 0530  AMMONIA 181* 90* 55*   CBC:  Recent Labs Lab 02/09/15 2358 02/10/15 0540 02/11/15 0530  WBC 6.0 5.9 7.7  NEUTROABS  --  3.2  --   HGB 12.0* 11.7* 10.9*  HCT 34.3* 33.3* 30.5*  MCV 92.7 92.5 91.9  PLT 101* 106* 102*   CBG:  Recent Labs Lab 02/10/15 1105 02/10/15 1634 02/10/15 2114 02/11/15 0806 02/11/15 1151  GLUCAP 117* 82 122* 128* 140*    Recent Results (from the past 240 hour(s))  MRSA PCR Screening     Status: None   Collection Time: 02/10/15  3:16 AM  Result Value Ref Range Status   MRSA by PCR NEGATIVE NEGATIVE Final    Comment:        The GeneXpert MRSA Assay (FDA approved for NASAL specimens only), is one component of a comprehensive MRSA colonization surveillance program. It is not intended to diagnose MRSA infection nor to guide or monitor treatment for MRSA infections.      Scheduled Meds: . amLODipine  10 mg Oral Daily  . gabapentin  300 mg Oral BID  . insulin aspart  0-9 Units Subcutaneous TID WC  . lactulose  20 g Oral Q4H  . sertraline  25 mg Oral Daily  . sodium chloride  3 mL Intravenous Q12H  . traZODone  50 mg Oral QHS   Continuous Infusions:   Time spent: 25 minutes  Pamella Pertostin [name], MD [facility] Pager [phone]. If 7 PM - 7 AM, please contact night-coverage at [email or web address], password [email or web address] 02/11/2015, 2:11 PM  LOS: 1 day

## 2015-02-11 NOTE — Evaluation (Signed)
Physical Therapy Evaluation Patient Details Name: Jermaine Alexander MRN: [id number] DOB: 1951-09-03 Today's Date: 02/11/2015   History of Present Illness  Pt adm with hepatic encephalopathy. PMH - cirrhosis, etoh, DM  Clinical Impression  Pt admitted with above diagnosis and presents to PT with functional limitations due to deficits listed below (See PT problem list). Pt needs skilled PT to maximize independence and safety to allow discharge to home. Expect pt will return to baseline.     Follow Up Recommendations No PT follow up    Equipment Recommendations  None recommended by PT    Recommendations for Other Services       Precautions / Restrictions Precautions Precautions: Fall Restrictions Weight Bearing Restrictions: No      Mobility  Bed Mobility Overal bed mobility: Modified Independent                Transfers Overall transfer level: Needs assistance Equipment used: None Transfers: Sit to/from Stand Sit to Stand: Min guard         General transfer comment: assist for safety  Ambulation/Gait Ambulation/Gait assistance: Min guard Ambulation Distance (Feet): 200 Feet Assistive device: None Gait Pattern/deviations: Step-through pattern;Decreased step length - right;Decreased step length - left Gait velocity: decr Gait velocity interpretation: Below normal speed for age/gender General Gait Details: Slightly unsteady but no loss of balance  Stairs            Wheelchair Mobility    Modified Rankin (Stroke Patients Only)       Balance Overall balance assessment: Needs assistance Sitting-balance support: No upper extremity supported;Feet supported Sitting balance-Leahy Scale: Good     Standing balance support: No upper extremity supported Standing balance-Leahy Scale: Fair                               Pertinent Vitals/Pain Pain Assessment: 0-10 Pain Score: 9  Pain Location: back Pain Intervention(s): Limited activity within  patient's tolerance;Repositioned    Home Living Family/patient expects to be discharged to:: Private residence Living Arrangements: Children Available Help at Discharge: Family;Available PRN/intermittently Type of Home: House       Home Layout: Two level;Bed/bath upstairs Home Equipment: Cane - single point      Prior Function Level of Independence: Independent with assistive device(s)         Comments: Uses cane at times     Hand Dominance        Extremity/Trunk Assessment   Upper Extremity Assessment: Overall WFL for tasks assessed           Lower Extremity Assessment: Overall WFL for tasks assessed         Communication   Communication: No difficulties  Cognition Arousal/Alertness: Awake/alert Behavior During Therapy: Flat affect Overall Cognitive Status: No family/caregiver present to determine baseline cognitive functioning                      General Comments      Exercises        Assessment/Plan    PT Assessment Patient needs continued PT services  PT Diagnosis Difficulty walking   PT Problem List Decreased activity tolerance;Decreased balance;Decreased mobility;Decreased knowledge of use of DME  PT Treatment Interventions DME instruction;Gait training;Functional mobility training;Therapeutic activities;Therapeutic exercise;Balance training;Patient/family education   PT Goals (Current goals can be found in the Care Plan section) Acute Rehab PT Goals Patient Stated Goal: return home PT Goal Formulation: With patient Time For Goal Achievement:  02/18/15 Potential to Achieve Goals: Good    Frequency Min 3X/week   Barriers to discharge        Co-evaluation               End of Session Equipment Utilized During Treatment: Gait belt Activity Tolerance: Patient tolerated treatment well;No increased pain Patient left: in bed;with call bell/phone within reach;with bed alarm set Nurse Communication: Mobility status          Time: 1355-1409 PT Time Calculation (min) (ACUTE ONLY): 14 min   Charges:   PT Evaluation $Initial PT Evaluation Tier I: 1 Procedure     PT G Codes:        Deryl Ports 2015/02/26, 2:53 PM  [facility] PT [phone]

## 2015-02-12 DIAGNOSIS — K7682 Hepatic encephalopathy: Secondary | ICD-10-CM

## 2015-02-12 DIAGNOSIS — K7460 Unspecified cirrhosis of liver: Secondary | ICD-10-CM

## 2015-02-12 DIAGNOSIS — E7220 Disorder of urea cycle metabolism, unspecified: Secondary | ICD-10-CM

## 2015-02-12 DIAGNOSIS — K7290 Hepatic failure, unspecified without coma: Secondary | ICD-10-CM

## 2015-02-12 DIAGNOSIS — N179 Acute kidney failure, unspecified: Secondary | ICD-10-CM

## 2015-02-12 LAB — COMPREHENSIVE METABOLIC PANEL
ALT: 33 U/L (ref 17–63)
AST: 71 U/L — ABNORMAL HIGH (ref 15–41)
Albumin: 2.6 g/dL — ABNORMAL LOW (ref 3.5–5.0)
Alkaline Phosphatase: 153 U/L — ABNORMAL HIGH (ref 38–126)
Anion gap: 8 (ref 5–15)
BILIRUBIN TOTAL: 1.4 mg/dL — AB (ref 0.3–1.2)
BUN: 13 mg/dL (ref 6–20)
CO2: 18 mmol/L — ABNORMAL LOW (ref 22–32)
Calcium: 8.7 mg/dL — ABNORMAL LOW (ref 8.9–10.3)
Chloride: 108 mmol/L (ref 101–111)
Creatinine, Ser: 1.03 mg/dL (ref 0.61–1.24)
Glucose, Bld: 134 mg/dL — ABNORMAL HIGH (ref 65–99)
Potassium: 3.7 mmol/L (ref 3.5–5.1)
SODIUM: 134 mmol/L — AB (ref 135–145)
TOTAL PROTEIN: 7.2 g/dL (ref 6.5–8.1)

## 2015-02-12 LAB — CBC
HEMATOCRIT: 30.6 % — AB (ref 39.0–52.0)
HEMOGLOBIN: 10.6 g/dL — AB (ref 13.0–17.0)
MCH: 32.3 pg (ref 26.0–34.0)
MCHC: 34.6 g/dL (ref 30.0–36.0)
MCV: 93.3 fL (ref 78.0–100.0)
Platelets: 106 10*3/uL — ABNORMAL LOW (ref 150–400)
RBC: 3.28 MIL/uL — ABNORMAL LOW (ref 4.22–5.81)
RDW: 13.7 % (ref 11.5–15.5)
WBC: 7.9 10*3/uL (ref 4.0–10.5)

## 2015-02-12 LAB — GLUCOSE, CAPILLARY
Glucose-Capillary: 137 mg/dL — ABNORMAL HIGH (ref 65–99)
Glucose-Capillary: 292 mg/dL — ABNORMAL HIGH (ref 65–99)

## 2015-02-12 LAB — PROTIME-INR
INR: 1.41 (ref 0.00–1.49)
PROTHROMBIN TIME: 17.4 s — AB (ref 11.6–15.2)

## 2015-02-12 LAB — HEMOGLOBIN A1C
Hgb A1c MFr Bld: 6.4 % — ABNORMAL HIGH (ref 4.8–5.6)
Mean Plasma Glucose: 137 mg/dL

## 2015-02-12 MED ORDER — LACTULOSE 10 GM/15ML PO SOLN
20.0000 g | Freq: Three times a day (TID) | ORAL | Status: DC
  Administered 2015-02-12: 20 g via ORAL
  Filled 2015-02-12 (×2): qty 30

## 2015-02-12 MED ORDER — RIFAXIMIN 550 MG PO TABS: 550.0000 mg | ORAL_TABLET | Freq: Two times a day (BID) | ORAL | Status: AC

## 2015-02-12 MED ORDER — AMLODIPINE BESYLATE 10 MG PO TABS: 10.0000 mg | ORAL_TABLET | Freq: Every day | ORAL | Status: AC

## 2015-02-12 MED ORDER — LACTULOSE 10 GM/15ML PO SOLN: 20.0000 g | Freq: Three times a day (TID) | ORAL | Status: AC

## 2015-02-12 MED ORDER — RIFAXIMIN 550 MG PO TABS
550.0000 mg | ORAL_TABLET | Freq: Two times a day (BID) | ORAL | Status: DC
  Administered 2015-02-12: 550 mg via ORAL
  Filled 2015-02-12 (×2): qty 1

## 2015-02-12 NOTE — Progress Notes (Signed)
Jermaine MalkinAlfred Alexander to be D/C'd Home per MD order.  Discussed with the patient and all questions fully answered.  VSS, Skin clean, dry and intact without evidence of skin break down, no evidence of skin tears noted. IV catheter discontinued intact. Site without signs and symptoms of complications. Dressing and pressure applied.  An After Visit Summary was printed and given to the patient. Patient received prescription.  D/c education completed with patient/family including follow up instructions, medication list, d/c activities limitations if indicated, with other d/c instructions as indicated by MD - patient able to verbalize understanding, all questions fully answered.   Patient instructed to return to ED, call 911, or call MD for any changes in condition.   Patient escorted via WC, and D/C home via private auto.  Beckey DowningFlores, [name] 02/12/2015 4:07 PM

## 2015-02-12 NOTE — Progress Notes (Signed)
Utilization review completed.  
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
## 2015-02-25 ENCOUNTER — Encounter (HOSPITAL_COMMUNITY): Payer: Self-pay | Admitting: Emergency Medicine

## 2015-02-25 ENCOUNTER — Inpatient Hospital Stay (HOSPITAL_COMMUNITY)
Admission: EM | Admit: 2015-02-25 | Discharge: 2015-03-01 | DRG: 442 | Disposition: A | Discharge status: Home Health | Payer: Medicare HMO | Attending: Internal Medicine | Admitting: Internal Medicine

## 2015-02-25 DIAGNOSIS — E872 Acidosis, unspecified: Secondary | ICD-10-CM

## 2015-02-25 DIAGNOSIS — Z87891 Personal history of nicotine dependence: Secondary | ICD-10-CM | POA: Diagnosis not present

## 2015-02-25 DIAGNOSIS — R531 Weakness: Secondary | ICD-10-CM | POA: Diagnosis present

## 2015-02-25 DIAGNOSIS — E119 Type 2 diabetes mellitus without complications: Secondary | ICD-10-CM | POA: Diagnosis present

## 2015-02-25 DIAGNOSIS — D696 Thrombocytopenia, unspecified: Secondary | ICD-10-CM | POA: Diagnosis present

## 2015-02-25 DIAGNOSIS — I10 Essential (primary) hypertension: Secondary | ICD-10-CM | POA: Diagnosis present

## 2015-02-25 DIAGNOSIS — K7460 Unspecified cirrhosis of liver: Secondary | ICD-10-CM | POA: Diagnosis present

## 2015-02-25 DIAGNOSIS — R188 Other ascites: Secondary | ICD-10-CM

## 2015-02-25 DIAGNOSIS — K7031 Alcoholic cirrhosis of liver with ascites: Secondary | ICD-10-CM | POA: Diagnosis present

## 2015-02-25 DIAGNOSIS — F4310 Post-traumatic stress disorder, unspecified: Secondary | ICD-10-CM | POA: Diagnosis present

## 2015-02-25 DIAGNOSIS — K7290 Hepatic failure, unspecified without coma: Secondary | ICD-10-CM | POA: Diagnosis present

## 2015-02-25 DIAGNOSIS — K5900 Constipation, unspecified: Secondary | ICD-10-CM | POA: Diagnosis present

## 2015-02-25 DIAGNOSIS — D649 Anemia, unspecified: Secondary | ICD-10-CM | POA: Diagnosis present

## 2015-02-25 DIAGNOSIS — N179 Acute kidney failure, unspecified: Secondary | ICD-10-CM | POA: Diagnosis present

## 2015-02-25 DIAGNOSIS — K7030 Alcoholic cirrhosis of liver without ascites: Secondary | ICD-10-CM | POA: Diagnosis not present

## 2015-02-25 DIAGNOSIS — E860 Dehydration: Secondary | ICD-10-CM | POA: Diagnosis present

## 2015-02-25 DIAGNOSIS — K7682 Hepatic encephalopathy: Secondary | ICD-10-CM | POA: Diagnosis present

## 2015-02-25 LAB — CBC WITH DIFFERENTIAL/PLATELET
Basophils Absolute: 0 10*3/uL (ref 0.0–0.1)
Basophils Relative: 0 % (ref 0–1)
EOS PCT: 5 % (ref 0–5)
Eosinophils Absolute: 0.3 10*3/uL (ref 0.0–0.7)
HCT: 28.9 % — ABNORMAL LOW (ref 39.0–52.0)
Hemoglobin: 10.1 g/dL — ABNORMAL LOW (ref 13.0–17.0)
Lymphocytes Relative: 33 % (ref 12–46)
Lymphs Abs: 2 10*3/uL (ref 0.7–4.0)
MCH: 32.4 pg (ref 26.0–34.0)
MCHC: 34.9 g/dL (ref 30.0–36.0)
MCV: 92.6 fL (ref 78.0–100.0)
Monocytes Absolute: 1.1 10*3/uL — ABNORMAL HIGH (ref 0.1–1.0)
Monocytes Relative: 19 % — ABNORMAL HIGH (ref 3–12)
Neutro Abs: 2.6 10*3/uL (ref 1.7–7.7)
Neutrophils Relative %: 44 % (ref 43–77)
PLATELETS: 103 10*3/uL — AB (ref 150–400)
RBC: 3.12 MIL/uL — ABNORMAL LOW (ref 4.22–5.81)
RDW: 14.4 % (ref 11.5–15.5)
WBC: 6 10*3/uL (ref 4.0–10.5)

## 2015-02-25 LAB — COMPREHENSIVE METABOLIC PANEL
ALT: 34 U/L (ref 17–63)
ANION GAP: 11 (ref 5–15)
AST: 56 U/L — ABNORMAL HIGH (ref 15–41)
Albumin: 2.9 g/dL — ABNORMAL LOW (ref 3.5–5.0)
Alkaline Phosphatase: 262 U/L — ABNORMAL HIGH (ref 38–126)
BUN: 27 mg/dL — AB (ref 6–20)
CALCIUM: 9.5 mg/dL (ref 8.9–10.3)
CO2: 16 mmol/L — ABNORMAL LOW (ref 22–32)
CREATININE: 1.97 mg/dL — AB (ref 0.61–1.24)
Chloride: 112 mmol/L — ABNORMAL HIGH (ref 101–111)
GFR calc non Af Amer: 34 mL/min — ABNORMAL LOW (ref >60)
GFR, EST AFRICAN AMERICAN: 40 mL/min — AB (ref >60)
GLUCOSE: 172 mg/dL — AB (ref 65–99)
Potassium: 4.4 mmol/L (ref 3.5–5.1)
Sodium: 139 mmol/L (ref 135–145)
TOTAL PROTEIN: 7.7 g/dL (ref 6.5–8.1)
Total Bilirubin: 1.1 mg/dL (ref 0.3–1.2)

## 2015-02-25 LAB — AMMONIA: Ammonia: 138 umol/L — ABNORMAL HIGH (ref 9–35)

## 2015-02-25 MED ORDER — LACTULOSE 10 GM/15ML PO SOLN: 30.0000 g | Freq: Once | ORAL | Status: DC

## 2015-02-25 MED ORDER — LACTULOSE 10 GM/15ML PO SOLN
20.0000 g | Freq: Once | ORAL | Status: AC
  Administered 2015-02-25: 20 g via ORAL
  Filled 2015-02-25: qty 30

## 2015-02-25 NOTE — ED Notes (Signed)
Pt. seen at [facility] clinic last Monday and was advised that his ammonia level is high , pt. reports dizziness with headache , legs swelling , headache and generalized weakness.

## 2015-02-25 NOTE — ED Provider Notes (Signed)
CSN: [id number]     Arrival date & time 02/25/15  2045 History   First MD Initiated Contact with Patient 02/25/15 2234     Chief complaint - altered mental status  LEVEL 5 CAVEAT - CONFUSION  Patient is a 64 y.o. male presenting with altered mental status. The history is provided by the patient and a relative. The history is limited by the condition of the patient.  Altered Mental Status Presenting symptoms: confusion   Severity:  Moderate Most recent episode: several days ago. Timing:  Constant Progression:  Worsening Chronicity:  Recurrent Context comment:  H/o cirrhosis Associated symptoms: weakness   Patient has h/o cirrhosis He has had bouts of hepatic encephalopathy previously He has been on home meds for HE - he is taking lactulose and rifaximin but apparently his ammonia levels are still rising Family reports is more confused and has generalized weakness He reports dizziness/weakness and mild HA He also has LE edema as well No trauma/fall reported today  Past Medical History  Diagnosis Date  . Diabetes mellitus without complication   . Hypertension   . PTSD (post-traumatic stress disorder)   . Cirrhosis   . Anemia    Past Surgical History  Procedure Laterality Date  . Cholecystectomy    . Ankle surgery    . Back surgery     No family history on file. History  Substance Use Topics  . Smoking status: Former Games developer  . Smokeless tobacco: Not on file  . Alcohol Use: No    Review of Systems  Unable to perform ROS: Mental status change  Neurological: Positive for weakness.  Psychiatric/Behavioral: Positive for confusion.      Allergies  Review of patient's allergies indicates no known allergies.  Home Medications   Prior to Admission medications   Medication Sig Start Date End Date Taking? Authorizing Provider  amLODipine (NORVASC) 10 MG tablet Take 1 tablet (10 mg total) by mouth daily. 02/12/15  Yes Catarina Hartshorn, MD  gabapentin (NEURONTIN) 300 MG capsule  Take 300 mg by mouth 3 (three) times daily.   Yes Historical Provider, MD  glipiZIDE (GLUCOTROL) 5 MG tablet Take 5 mg by mouth daily before breakfast.   Yes Historical Provider, MD  hydroxypropyl methylcellulose / hypromellose (ISOPTO TEARS / GONIOVISC) 2.5 % ophthalmic solution Place 1 drop into both eyes 4 (four) times daily as needed for dry eyes.   Yes Historical Provider, MD  lactulose (CHRONULAC) 10 GM/15ML solution Take 30 mLs (20 g total) by mouth 3 (three) times daily. 02/12/15  Yes Catarina Hartshorn, MD  lisinopril (PRINIVIL,ZESTRIL) 10 MG tablet Take 10 mg by mouth daily.   Yes Historical Provider, MD  metFORMIN (GLUCOPHAGE) 500 MG tablet Take 500 mg by mouth 2 (two) times daily with a meal.   Yes Historical Provider, MD  rifaximin (XIFAXAN) 550 MG TABS tablet Take 1 tablet (550 mg total) by mouth 2 (two) times daily. 02/12/15  Yes Catarina Hartshorn, MD  sertraline (ZOLOFT) 50 MG tablet Take 25 mg by mouth daily.   Yes Historical Provider, MD  traMADol (ULTRAM) 50 MG tablet Take 50 mg by mouth every 6 (six) hours as needed for moderate pain.    Yes Historical Provider, MD  traZODone (DESYREL) 50 MG tablet Take 50 mg by mouth at bedtime.   Yes Historical Provider, MD  cyclobenzaprine (FLEXERIL) 10 MG tablet Take 0.5 tablets (5 mg total) by mouth 3 (three) times daily as needed for muscle spasms. Patient not taking: Reported on 02/25/2015 02/05/15  Mercedes Camprubi-Soms, PA-C  HYDROcodone-acetaminophen (NORCO) 5-325 MG per tablet Take 1 tablet by mouth every 6 (six) hours as needed for severe pain. Patient not taking: Reported on 02/25/2015 02/05/15   Mercedes Camprubi-Soms, PA-C   BP 139/68 mmHg  Pulse 105  Temp(Src) 98.5 F (36.9 C) (Oral)  Resp 20  SpO2 98% Physical Exam CONSTITUTIONAL: Well developed HEAD: Normocephalic/atraumatic EYES: EOMI/PERRL, icterus noted ENMT: Mucous membranes moist NECK: supple no meningeal signs SPINE/BACK:entire spine nontender CV: S1/S2 noted, no murmurs/rubs/gallops  noted LUNGS: Lungs are clear to auscultation bilaterally, no apparent distress ABDOMEN: soft, distended but no focal tenderness, no rebound or guarding, bowel sounds noted throughout abdomen GU:no cva tenderness NEURO: Pt is awake/alert.  He is confused - he is unsure of his birthdate and unsure of current year.  He is conversant.  He can move all extremities without difficulty   EXTREMITIES: pulses normal/equal, full ROM, edema noted to bilateral LE, no evidence of trauma SKIN: warm, color normal PSYCH: unable to assess  ED Course  Procedures   11:15 PM Pt with recurrent hepatic encephalopathy despite med compliance Will need admission 11:45 PM D/w dr Leodis Binet Will admit to tele for monitoring Pt currently awake/alert at this time  Labs Review Labs Reviewed  CBC WITH DIFFERENTIAL/PLATELET - Abnormal; Notable for the following:    RBC 3.12 (*)    Hemoglobin 10.1 (*)    HCT 28.9 (*)    Platelets 103 (*)    Monocytes Relative 19 (*)    Monocytes Absolute 1.1 (*)    All other components within normal limits  COMPREHENSIVE METABOLIC PANEL - Abnormal; Notable for the following:    Chloride 112 (*)    CO2 16 (*)    Glucose, Bld 172 (*)    BUN 27 (*)    Creatinine, Ser 1.97 (*)    Albumin 2.9 (*)    AST 56 (*)    Alkaline Phosphatase 262 (*)    GFR calc non Af Amer 34 (*)    GFR calc Af Amer 40 (*)    All other components within normal limits  AMMONIA - Abnormal; Notable for the following:    Ammonia 138 (*)    All other components within normal limits    Medications  lactulose (CHRONULAC) 10 GM/15ML solution 20 g (20 g Oral Given 02/25/15 2337)     MDM   Final diagnoses:  Hepatic encephalopathy  Metabolic acidosis  Dehydration    Nursing notes including past medical history and social history reviewed and considered in documentation Labs/vital reviewed myself and considered during evaluation Previous records reviewed and considered     Zadie Rhine,  MD 02/25/15 2345

## 2015-02-26 DIAGNOSIS — D696 Thrombocytopenia, unspecified: Secondary | ICD-10-CM

## 2015-02-26 DIAGNOSIS — E119 Type 2 diabetes mellitus without complications: Secondary | ICD-10-CM

## 2015-02-26 DIAGNOSIS — K7460 Unspecified cirrhosis of liver: Secondary | ICD-10-CM

## 2015-02-26 DIAGNOSIS — N179 Acute kidney failure, unspecified: Secondary | ICD-10-CM

## 2015-02-26 DIAGNOSIS — D649 Anemia, unspecified: Secondary | ICD-10-CM

## 2015-02-26 DIAGNOSIS — E118 Type 2 diabetes mellitus with unspecified complications: Secondary | ICD-10-CM

## 2015-02-26 DIAGNOSIS — K7290 Hepatic failure, unspecified without coma: Principal | ICD-10-CM

## 2015-02-26 LAB — CBC
HEMATOCRIT: 28.7 % — AB (ref 39.0–52.0)
Hemoglobin: 9.9 g/dL — ABNORMAL LOW (ref 13.0–17.0)
MCH: 32.2 pg (ref 26.0–34.0)
MCHC: 34.5 g/dL (ref 30.0–36.0)
MCV: 93.5 fL (ref 78.0–100.0)
Platelets: 101 10*3/uL — ABNORMAL LOW (ref 150–400)
RBC: 3.07 MIL/uL — ABNORMAL LOW (ref 4.22–5.81)
RDW: 14.5 % (ref 11.5–15.5)
WBC: 5.3 10*3/uL (ref 4.0–10.5)

## 2015-02-26 LAB — GLUCOSE, CAPILLARY
GLUCOSE-CAPILLARY: 141 mg/dL — AB (ref 65–99)
Glucose-Capillary: 140 mg/dL — ABNORMAL HIGH (ref 65–99)
Glucose-Capillary: 155 mg/dL — ABNORMAL HIGH (ref 65–99)
Glucose-Capillary: 176 mg/dL — ABNORMAL HIGH (ref 65–99)

## 2015-02-26 LAB — COMPREHENSIVE METABOLIC PANEL
ALBUMIN: 2.7 g/dL — AB (ref 3.5–5.0)
ALK PHOS: 237 U/L — AB (ref 38–126)
ALT: 32 U/L (ref 17–63)
ANION GAP: 10 (ref 5–15)
AST: 51 U/L — ABNORMAL HIGH (ref 15–41)
BUN: 22 mg/dL — ABNORMAL HIGH (ref 6–20)
CALCIUM: 9.6 mg/dL (ref 8.9–10.3)
CO2: 17 mmol/L — AB (ref 22–32)
CREATININE: 1.73 mg/dL — AB (ref 0.61–1.24)
Chloride: 112 mmol/L — ABNORMAL HIGH (ref 101–111)
GFR calc non Af Amer: 40 mL/min — ABNORMAL LOW (ref >60)
GFR, EST AFRICAN AMERICAN: 47 mL/min — AB (ref >60)
GLUCOSE: 169 mg/dL — AB (ref 65–99)
Potassium: 4.3 mmol/L (ref 3.5–5.1)
SODIUM: 139 mmol/L (ref 135–145)
TOTAL PROTEIN: 7.3 g/dL (ref 6.5–8.1)
Total Bilirubin: 1.1 mg/dL (ref 0.3–1.2)

## 2015-02-26 LAB — PROTIME-INR
INR: 1.28 (ref 0.00–1.49)
Prothrombin Time: 16.1 seconds — ABNORMAL HIGH (ref 11.6–15.2)

## 2015-02-26 MED ORDER — FOLIC ACID 1 MG PO TABS
1.0000 mg | ORAL_TABLET | Freq: Every day | ORAL | Status: DC
  Administered 2015-02-26 – 2015-03-01 (×4): 1 mg via ORAL
  Filled 2015-02-26 (×4): qty 1

## 2015-02-26 MED ORDER — TRAZODONE HCL 50 MG PO TABS
50.0000 mg | ORAL_TABLET | Freq: Every day | ORAL | Status: DC
  Administered 2015-02-26 (×2): 50 mg via ORAL
  Filled 2015-02-26 (×3): qty 1

## 2015-02-26 MED ORDER — INSULIN ASPART 100 UNIT/ML ~~LOC~~ SOLN: 0.0000 [IU] | Freq: Every day | SUBCUTANEOUS | Status: DC

## 2015-02-26 MED ORDER — ONDANSETRON HCL 4 MG PO TABS
4.0000 mg | ORAL_TABLET | Freq: Four times a day (QID) | ORAL | Status: DC | PRN
Start: 2015-02-26 — End: 2015-03-01

## 2015-02-26 MED ORDER — SODIUM CHLORIDE 0.9 % IJ SOLN
3.0000 mL | Freq: Two times a day (BID) | INTRAMUSCULAR | Status: DC
  Administered 2015-02-26 – 2015-03-01 (×8): 3 mL via INTRAVENOUS

## 2015-02-26 MED ORDER — CEFTRIAXONE SODIUM IN DEXTROSE 20 MG/ML IV SOLN
1.0000 g | Freq: Once | INTRAVENOUS | Status: AC
  Administered 2015-02-26: 1 g via INTRAVENOUS
  Filled 2015-02-26: qty 50

## 2015-02-26 MED ORDER — VITAMIN B-1 100 MG PO TABS
100.0000 mg | ORAL_TABLET | Freq: Every day | ORAL | Status: DC
  Administered 2015-02-26 – 2015-03-01 (×4): 100 mg via ORAL
  Filled 2015-02-26 (×4): qty 1

## 2015-02-26 MED ORDER — GABAPENTIN 300 MG PO CAPS
300.0000 mg | ORAL_CAPSULE | Freq: Three times a day (TID) | ORAL | Status: DC
  Administered 2015-02-26 – 2015-02-27 (×5): 300 mg via ORAL
  Filled 2015-02-26 (×7): qty 1

## 2015-02-26 MED ORDER — ONDANSETRON HCL 4 MG/2ML IJ SOLN
4.0000 mg | Freq: Four times a day (QID) | INTRAMUSCULAR | Status: DC | PRN
  Administered 2015-02-27: 4 mg via INTRAVENOUS
  Filled 2015-02-26: qty 2

## 2015-02-26 MED ORDER — FUROSEMIDE 20 MG PO TABS
10.0000 mg | ORAL_TABLET | Freq: Every day | ORAL | Status: DC
  Administered 2015-02-26: 10 mg via ORAL
  Filled 2015-02-26: qty 0.5

## 2015-02-26 MED ORDER — SERTRALINE HCL 25 MG PO TABS
25.0000 mg | ORAL_TABLET | Freq: Every day | ORAL | Status: DC
  Administered 2015-02-26 – 2015-03-01 (×4): 25 mg via ORAL
  Filled 2015-02-26 (×4): qty 1

## 2015-02-26 MED ORDER — FUROSEMIDE 20 MG PO TABS
20.0000 mg | ORAL_TABLET | Freq: Every day | ORAL | Status: DC
  Administered 2015-02-27 – 2015-03-01 (×3): 20 mg via ORAL
  Filled 2015-02-26 (×3): qty 1

## 2015-02-26 MED ORDER — LACTULOSE 10 GM/15ML PO SOLN
20.0000 g | ORAL | Status: DC
  Administered 2015-02-26 – 2015-02-27 (×10): 20 g via ORAL
  Filled 2015-02-26 (×15): qty 30

## 2015-02-26 MED ORDER — RIFAXIMIN 550 MG PO TABS
550.0000 mg | ORAL_TABLET | Freq: Two times a day (BID) | ORAL | Status: DC
  Administered 2015-02-26 – 2015-03-01 (×8): 550 mg via ORAL
  Filled 2015-02-26 (×9): qty 1

## 2015-02-26 MED ORDER — SPIRONOLACTONE 25 MG PO TABS
25.0000 mg | ORAL_TABLET | Freq: Every day | ORAL | Status: DC
  Administered 2015-02-26 – 2015-03-01 (×4): 25 mg via ORAL
  Filled 2015-02-26 (×4): qty 1

## 2015-02-26 MED ORDER — CEFTRIAXONE SODIUM IN DEXTROSE 20 MG/ML IV SOLN
1.0000 g | INTRAVENOUS | Status: DC
  Filled 2015-02-26: qty 50

## 2015-02-26 MED ORDER — INSULIN ASPART 100 UNIT/ML ~~LOC~~ SOLN
0.0000 [IU] | Freq: Three times a day (TID) | SUBCUTANEOUS | Status: DC
  Administered 2015-02-26: 3 [IU] via SUBCUTANEOUS
  Administered 2015-02-26: 2 [IU] via SUBCUTANEOUS
  Administered 2015-02-27 (×2): 3 [IU] via SUBCUTANEOUS
  Administered 2015-02-27: 2 [IU] via SUBCUTANEOUS
  Administered 2015-02-28 – 2015-03-01 (×4): 3 [IU] via SUBCUTANEOUS
  Administered 2015-03-01: 2 [IU] via SUBCUTANEOUS

## 2015-02-26 MED ORDER — FUROSEMIDE 10 MG/ML IJ SOLN
20.0000 mg | Freq: Once | INTRAMUSCULAR | Status: AC
  Administered 2015-02-26: 20 mg via INTRAVENOUS
  Filled 2015-02-26 (×2): qty 2

## 2015-02-26 MED ORDER — TRAMADOL HCL 50 MG PO TABS
50.0000 mg | ORAL_TABLET | Freq: Four times a day (QID) | ORAL | Status: DC | PRN
  Administered 2015-02-26 – 2015-02-27 (×5): 50 mg via ORAL
  Filled 2015-02-26 (×5): qty 1

## 2015-02-26 NOTE — Consult Note (Signed)
Referring Provider: Dr. Lendell Caprice Primary Care Physician:  [name], MD Primary Gastroenterologist:  Gentry Fitz  Reason for Consultation:  Hepatic Encephalopathy  HPI: Jermaine Alexander is a 64 y.o. male with alcoholic cirrhosis (reports abstinence in the past 2 years) who was in the hospital earlier this month for hepatic encephalopathy and discharged on 02/12/15. Patient reports slow stools despite Lactulose QID this week. Denies melena or hematochezia. Denies abdominal pain. Has gained 13 pounds since discharge on 02/12/15. Denies N/V. Ammonia 138 (55 on 02/11/15). Alert and oriented X 3. Daughters and grandchildren in room.   Past Medical History  Diagnosis Date  . Diabetes mellitus without complication   . Hypertension   . PTSD (post-traumatic stress disorder)   . Cirrhosis   . Anemia     Past Surgical History  Procedure Laterality Date  . Cholecystectomy    . Ankle surgery    . Back surgery      Prior to Admission medications   Medication Sig Start Date End Date Taking? Authorizing Provider  amLODipine (NORVASC) 10 MG tablet Take 1 tablet (10 mg total) by mouth daily. 02/12/15  Yes Catarina Hartshorn, MD  gabapentin (NEURONTIN) 300 MG capsule Take 300 mg by mouth 3 (three) times daily.   Yes Historical Provider, MD  glipiZIDE (GLUCOTROL) 5 MG tablet Take 5 mg by mouth daily before breakfast.   Yes Historical Provider, MD  hydroxypropyl methylcellulose / hypromellose (ISOPTO TEARS / GONIOVISC) 2.5 % ophthalmic solution Place 1 drop into both eyes 4 (four) times daily as needed for dry eyes.   Yes Historical Provider, MD  lactulose (CHRONULAC) 10 GM/15ML solution Take 30 mLs (20 g total) by mouth 3 (three) times daily. 02/12/15  Yes Catarina Hartshorn, MD  lisinopril (PRINIVIL,ZESTRIL) 10 MG tablet Take 10 mg by mouth daily.   Yes Historical Provider, MD  metFORMIN (GLUCOPHAGE) 500 MG tablet Take 500 mg by mouth 2 (two) times daily with a meal.   Yes Historical Provider, MD  rifaximin (XIFAXAN) 550 MG TABS  tablet Take 1 tablet (550 mg total) by mouth 2 (two) times daily. 02/12/15  Yes Catarina Hartshorn, MD  sertraline (ZOLOFT) 50 MG tablet Take 25 mg by mouth daily.   Yes Historical Provider, MD  traMADol (ULTRAM) 50 MG tablet Take 50 mg by mouth every 6 (six) hours as needed for moderate pain.    Yes Historical Provider, MD  traZODone (DESYREL) 50 MG tablet Take 50 mg by mouth at bedtime.   Yes Historical Provider, MD  cyclobenzaprine (FLEXERIL) 10 MG tablet Take 0.5 tablets (5 mg total) by mouth 3 (three) times daily as needed for muscle spasms. Patient not taking: Reported on 02/25/2015 02/05/15   Mercedes Camprubi-Soms, PA-C  HYDROcodone-acetaminophen (NORCO) 5-325 MG per tablet Take 1 tablet by mouth every 6 (six) hours as needed for severe pain. Patient not taking: Reported on 02/25/2015 02/05/15   Mercedes Camprubi-Soms, PA-C    Scheduled Meds: . folic acid  1 mg Oral Daily  . [START ON 02/27/2015] furosemide  20 mg Oral Daily  . gabapentin  300 mg Oral TID  . insulin aspart  0-15 Units Subcutaneous TID WC  . insulin aspart  0-5 Units Subcutaneous QHS  . lactulose  20 g Oral 6 times per day  . rifaximin  550 mg Oral BID  . sertraline  25 mg Oral Daily  . sodium chloride  3 mL Intravenous Q12H  . spironolactone  25 mg Oral Daily  . thiamine  100 mg Oral Daily  .  traZODone  50 mg Oral QHS   Continuous Infusions:  PRN Meds:.ondansetron **OR** ondansetron (ZOFRAN) IV, traMADol  Allergies as of 02/25/2015  . (No Known Allergies)    No family history on file.  History   Social History  . Marital Status: Divorced    Spouse Name: N/A  . Number of Children: N/A  . Years of Education: N/A   Occupational History  . Not on file.   Social History Main Topics  . Smoking status: Former Games developer  . Smokeless tobacco: Not on file  . Alcohol Use: No  . Drug Use: Yes    Special: Marijuana     Comment: ocassionaly  . Sexual Activity: Not on file   Other Topics Concern  . Not on file   Social  History Narrative    Review of Systems: All negative except as stated above in HPI.  Physical Exam: Vital signs: Filed Vitals:   02/26/15 0954  BP: 132/72  Pulse: 94  Temp: 98 F (36.7 C)  Resp: 18   Last BM Date: 02/25/15 General:   Alert,  Well-developed, well-nourished, pleasant and cooperative in NAD, oriented X 3 Head: atraumatic Eyes: pupils equal and reactive, anicteric sclera ENT: oropharynx clear Neck: supple, nontender Lungs:  Clear throughout to auscultation.   No wheezes, crackles, or rhonchi. No acute distress. Heart:  Regular rate and rhythm; no murmurs, clicks, rubs,  or gallops. Abdomen: distended, diffusely tender with guarding, +BS  Rectal:  Deferred Ext: +LE edema Neuro: +asterixis Skin: no rash Psych: normal mood, affect  GI:  Lab Results:  Recent Labs  02/25/15 2058 02/26/15 0409  WBC 6.0 5.3  HGB 10.1* 9.9*  HCT 28.9* 28.7*  PLT 103* 101*   BMET  Recent Labs  02/25/15 2058 02/26/15 0409  NA 139 139  K 4.4 4.3  CL 112* 112*  CO2 16* 17*  GLUCOSE 172* 169*  BUN 27* 22*  CREATININE 1.97* 1.73*  CALCIUM 9.5 9.6   LFT  Recent Labs  02/26/15 0409  PROT 7.3  ALBUMIN 2.7*  AST 51*  ALT 32  ALKPHOS 237*  BILITOT 1.1   PT/INR  Recent Labs  02/26/15 0409  LABPROT 16.1*  INR 1.28     Studies/Results: No results found.  Impression/Plan: 64 yo with alcoholic cirrhosis and admission for hepatic encephalopathy likely due to constipation although SBP also possible. No evidence of GI bleeding. Agree with paracentesis to look for SBP. Continue Lactulose and Rifaximin. Supportive care. Will follow.    LOS: 1 day   Alanny Rivers C.  02/26/2015, 6:34 PM  Pager [phone]  If no answer or after 5 PM call [phone]

## 2015-02-26 NOTE — Progress Notes (Signed)
64yo male reports elevated ammonia, dizziness, HA, and generalized weakness, known h/o cirrhosis, concern for SBP, to begin IV ABX empirically.  Will start Rocephin 1g IV Q24H and monitor CBC and Cx.  Vernard Gambles, PharmD, BCPS 02/26/2015 1:17 AM

## 2015-02-26 NOTE — Progress Notes (Signed)
UR completed.    Gavon Majano W. Jemarion Roycroft, RN, BSN  Trauma/Neuro ICU Case Manager [phone] 

## 2015-02-26 NOTE — H&P (Addendum)
[facility] History and Physical  Patient: Jermaine Alexander  MRN: [id number]  DOB: [birth year]  DOS: the patient was seen and examined on 02/26/2015 PCP: [name], MD  Referring physician: Dr. Bebe Shaggy Chief Complaint: Infusion  HPI: Jermaine Alexander is a 64 y.o. 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 The patient is presenting with complaints of dizziness as well as generalized weakness with increased confusion. History was obtained from patient's daughter as well as patient. The patient was recently hospitalized for acute kidney injury and on discharge he was taken off of his Lasix and hydrochlorothiazide. Patient had a follow-up with his PCP and the his Flexeril was switched to Robaxin and his Tylenol was switched to Norco. He also had an ammonia level checked at outside doing that visit and was found to be having significantly elevated ammonia level and was recommended to increase the lactulose from 3 times a day to 4 times a day. He has been using lactulose on a regular basis despite that he does not have soft bowel movement but he continues to have 2-3 daily regular bowel movements. Denies any active bleeding. Complains of mild abdominal tenderness which is diffuse. Denies any fever or chills denies any nausea or vomiting denies any shortness of breath or cough. He mentions he is compliant with all his medications and does not drink any alcohol anymore. Does not remember having any EGD or does not remember having any paracentesis  The patient is coming from home. And at his baseline independent for most of his ADL.  Review of Systems: as mentioned in the history of present illness.  A comprehensive review of the other systems is negative.  Past Medical History  Diagnosis Date  . Diabetes mellitus without complication   . Hypertension   . PTSD (post-traumatic stress  disorder)   . Cirrhosis   . Anemia    Past Surgical History  Procedure Laterality Date  . Cholecystectomy    . Ankle surgery    . Back surgery     Social History:  reports that he has quit smoking. He does not have any smokeless tobacco history on file. He reports that he uses illicit drugs (Marijuana). He reports that he does not drink alcohol.  No Known Allergies  No family history on file.  Prior to Admission medications   Medication Sig Start Date End Date Taking? Authorizing Provider  amLODipine (NORVASC) 10 MG tablet Take 1 tablet (10 mg total) by mouth daily. 02/12/15  Yes Catarina Hartshorn, MD  gabapentin (NEURONTIN) 300 MG capsule Take 300 mg by mouth 3 (three) times daily.   Yes Historical Provider, MD  glipiZIDE (GLUCOTROL) 5 MG tablet Take 5 mg by mouth daily before breakfast.   Yes Historical Provider, MD  hydroxypropyl methylcellulose / hypromellose (ISOPTO TEARS / GONIOVISC) 2.5 % ophthalmic solution Place 1 drop into both eyes 4 (four) times daily as needed for dry eyes.   Yes Historical Provider, MD  lactulose (CHRONULAC) 10 GM/15ML solution Take 30 mLs (20 g total) by mouth 3 (three) times daily. 02/12/15  Yes Catarina Hartshorn, MD  lisinopril (PRINIVIL,ZESTRIL) 10 MG tablet Take 10 mg by mouth daily.   Yes Historical Provider, MD  metFORMIN (GLUCOPHAGE) 500 MG tablet Take 500 mg by mouth 2 (two) times daily with a meal.   Yes Historical Provider, MD  rifaximin (XIFAXAN) 550 MG TABS tablet Take 1 tablet (550 mg total) by mouth 2 (two)  times daily. 02/12/15  Yes Catarina Hartshorn, MD  sertraline (ZOLOFT) 50 MG tablet Take 25 mg by mouth daily.   Yes Historical Provider, MD  traMADol (ULTRAM) 50 MG tablet Take 50 mg by mouth every 6 (six) hours as needed for moderate pain.    Yes Historical Provider, MD  traZODone (DESYREL) 50 MG tablet Take 50 mg by mouth at bedtime.   Yes Historical Provider, MD  cyclobenzaprine (FLEXERIL) 10 MG tablet Take 0.5 tablets (5 mg total) by mouth 3 (three) times daily as  needed for muscle spasms. Patient not taking: Reported on 02/25/2015 02/05/15   Mercedes Camprubi-Soms, PA-C  HYDROcodone-acetaminophen (NORCO) 5-325 MG per tablet Take 1 tablet by mouth every 6 (six) hours as needed for severe pain. Patient not taking: Reported on 02/25/2015 02/05/15   Allen Derry, PA-C    Physical Exam: Filed Vitals:   02/25/15 2345 02/26/15 0000 02/26/15 0015 02/26/15 0100  BP: 143/73 143/58 157/69 146/72  Pulse: 102 101 102 108  Temp:    97.8 F (36.6 C)  TempSrc:    Oral  Resp: Height:     (1.88 m)  Weight:    93.668 kg (206 lb 8 oz)  SpO2: 100% 98% 93% 100%    General: Alert, Awake and Oriented to Time, Place and Person. Appear in mild distress Eyes: PERRL ENT: Oral Mucosa clear moist. Neck: no JVD Cardiovascular: S1 and S2 Present, aortic systolic Murmur, Peripheral Pulses Present Respiratory: Bilateral Air entry equal and Decreased,  Clear to Auscultation, no Crackles, no wheezes Abdomen: Bowel Sound present, distended, Soft and mildly diffusely tender Skin: no Rash Extremities: Bilateral Pedal edema, no calf tenderness Neurologic: Grossly no focal neuro deficit other than asterixis  Labs on Admission:  CBC:  Recent Labs Lab 02/25/15 2058  WBC 6.0  NEUTROABS 2.6  HGB 10.1*  HCT 28.9*  MCV 92.6  PLT 103*    CMP     Component Value Date/Time   NA 139 02/25/2015 2058   K 4.4 02/25/2015 2058   CL 112* 02/25/2015 2058   CO2 16* 02/25/2015 2058   GLUCOSE 172* 02/25/2015 2058   BUN 27* 02/25/2015 2058   CREATININE 1.97* 02/25/2015 2058   CALCIUM 9.5 02/25/2015 2058   PROT 7.7 02/25/2015 2058   ALBUMIN 2.9* 02/25/2015 2058   AST 56* 02/25/2015 2058   ALT 34 02/25/2015 2058   ALKPHOS 262* 02/25/2015 2058   BILITOT 1.1 02/25/2015 2058   GFRNONAA 34* 02/25/2015 2058   GFRAA 40* 02/25/2015 2058   Assessment/Plan Principal Problem:   Hepatic encephalopathy Active Problems:   Anemia   Cirrhosis   AKI (acute  kidney injury)   Thrombocytopenia   Type 2 diabetes mellitus   1. Hepatic encephalopathy The patient is presenting with complaints of progressively worsening confusion and sleepiness and lethargy. His ammonia level is 138. He also has asterixis. With this the patient will be admitted in the hospital. Currently I will switch his lactulose to every 4 hours and then he has 2-3 soft daily bowel movement. I would also put him on ceftriaxone for SBP prophylaxis as he has some abdominal tenderness. He may benefit from paracentesis in the morning.  2. Liver cirrhosis. The patient was initially on Lasix which was discontinued during last admission and has not been resumed yet. He presents today with swelling and significant weight gain. With this I would resume his Lasix starting tomorrow and give him a 20 mg IV Lasix tonight. I  would monitor his ins and outs as he had urinary retention last admission. I would also initiate Aldactone. He would benefit from discontinuing other blood pressure medications.  3. Chronic anemia as well as thrombocytopenia. Most likely secondary to liver cirrhosis. Continue close monitoring patient denies any active bleeding. Initiate Protonix. He has a GI referral and will benefit from EGD.  4. Type 2 diabetes mellitus. Holding metformin as well as glipizide and placing him on sliding scale with insulin.  5. History of mood disorder. Continuing Zoloft.  6. Worsening renal function. Next on the patient has evidence of volume overload with increased weight gain as well as edema at present. I suspect his renal function will improve with IV diuretic continue close monitoring.  Advance goals of care discussion: Full code   DVT Prophylaxis: mechanical compression device  Nutrition: Nothing by mouth except medications  Family Communication: family was present at bedside, opportunity was given to ask question and all questions were answered satisfactorily at the  time of interview. Disposition: Admitted as inpatient, Telemetry unit.  Author: Lynden Oxford, MD [facility] Pager: [phone] 02/26/2015  If 7PM-7AM, please contact night-coverage [email or web address] Password [email or web address]

## 2015-02-26 NOTE — Progress Notes (Signed)
admitted after midnight. Chart reviewed. Patient examined. Still with asterixis. Abdominal distention. Will ask IR to see whether enough fluid to tap. Hold Rocephin for now. Family requesting GI consult. Apparently, primary care provider recommended it. Will consult [facility]. Continue lactulose, diuretics, xifaxan  Crista Curb, MD [facility]

## 2015-02-27 ENCOUNTER — Inpatient Hospital Stay (HOSPITAL_COMMUNITY): Payer: Medicare HMO

## 2015-02-27 DIAGNOSIS — K7030 Alcoholic cirrhosis of liver without ascites: Secondary | ICD-10-CM

## 2015-02-27 LAB — GLUCOSE, CAPILLARY
GLUCOSE-CAPILLARY: 159 mg/dL — AB (ref 65–99)
Glucose-Capillary: 152 mg/dL — ABNORMAL HIGH (ref 65–99)
Glucose-Capillary: 136 mg/dL — ABNORMAL HIGH (ref 65–99)
Glucose-Capillary: 175 mg/dL — ABNORMAL HIGH (ref 65–99)

## 2015-02-27 LAB — BASIC METABOLIC PANEL
ANION GAP: 7 (ref 5–15)
BUN: 20 mg/dL (ref 6–20)
CO2: 18 mmol/L — ABNORMAL LOW (ref 22–32)
Calcium: 9.4 mg/dL (ref 8.9–10.3)
Chloride: 112 mmol/L — ABNORMAL HIGH (ref 101–111)
Creatinine, Ser: 1.49 mg/dL — ABNORMAL HIGH (ref 0.61–1.24)
GFR calc non Af Amer: 48 mL/min — ABNORMAL LOW (ref >60)
GFR, EST AFRICAN AMERICAN: 56 mL/min — AB (ref >60)
Glucose, Bld: 143 mg/dL — ABNORMAL HIGH (ref 65–99)
POTASSIUM: 4.5 mmol/L (ref 3.5–5.1)
Sodium: 137 mmol/L (ref 135–145)

## 2015-02-27 MED ORDER — TRAMADOL HCL 50 MG PO TABS: 50.0000 mg | ORAL_TABLET | Freq: Two times a day (BID) | ORAL | Status: DC | PRN

## 2015-02-27 MED ORDER — LACTULOSE 10 GM/15ML PO SOLN
30.0000 g | ORAL | Status: DC
  Administered 2015-02-27 – 2015-03-01 (×12): 30 g via ORAL
  Filled 2015-02-27 (×18): qty 45

## 2015-02-27 NOTE — Progress Notes (Signed)
Patient scheduled today for paracentesis, upon reviewing [place] images no ascites is seen. Procedure is cancelled.   Pattricia Boss PA-C Interventional Radiology  02/27/15  10:49 AM

## 2015-02-27 NOTE — Progress Notes (Signed)
[facility] PROGRESS NOTE  Arrin Pintor WUJ:[id number] DOB: 1951/06/03 DOA: 02/25/2015 PCP: [name], MD  Assessment/Plan:  Principal Problem:   Hepatic encephalopathy: still lethargic. Not sure stools are being recorded. Increase lactulose. Cont rifaximin. Stop gabapentin and ultram. Pt eval Active Problems:   Anemia   Cirrhosis: no significant ascites to tap. Continue diuretics and monitor lytes   AKI (acute kidney injury) improving   Thrombocytopenia   Type 2 diabetes mellitus  HPI/Subjective: Still sleepy  Objective: Filed Vitals:   02/27/15 1457  BP: 140/63  Pulse: 105  Temp: 97.9 F (36.6 C)  Resp:     Intake/Output Summary (Last 24 hours) at 02/27/15 1539 Last data filed at 02/27/15 0925  Gross per 24 hour  Intake    130 ml  Output    600 ml  Net   -470 ml   Filed Weights   02/26/15 0100 02/27/15 0543  Weight: 93.668 kg (206 lb 8 oz) 91.672 kg (202 lb 1.6 oz)    Exam:   General:  Asleep. Arousable. Answers questions but remains drowsy  Cardiovascular: RRR without MGR  Respiratory: CTA without WRR  Abdomen: S, distended.  Ext: no CCE  Basic Metabolic Panel:  Recent Labs Lab 02/25/15 2058 02/26/15 0409 02/27/15 0349  NA 139 139 137  K 4.4 4.3 4.5  CL 112* 112* 112*  CO2 16* 17* 18*  GLUCOSE 172* 169* 143*  BUN 27* 22* 20  CREATININE 1.97* 1.73* 1.49*  CALCIUM 9.5 9.6 9.4   Liver Function Tests:  Recent Labs Lab 02/25/15 2058 02/26/15 0409  AST 56* 51*  ALT 34 32  ALKPHOS 262* 237*  BILITOT 1.1 1.1  PROT 7.7 7.3  ALBUMIN 2.9* 2.7*   No results for input(s): LIPASE, AMYLASE in the last 168 hours.  Recent Labs Lab 02/25/15 2058  AMMONIA 138*   CBC:  Recent Labs Lab 02/25/15 2058 02/26/15 0409  WBC 6.0 5.3  NEUTROABS 2.6  --   HGB 10.1* 9.9*  HCT 28.9* 28.7*  MCV 92.6 93.5  PLT 103* 101*   Cardiac Enzymes: No results for input(s): CKTOTAL, CKMB, CKMBINDEX, TROPONINI in the last 168 hours. BNP (last 3  results) No results for input(s): BNP in the last 8760 hours.  ProBNP (last 3 results) No results for input(s): PROBNP in the last 8760 hours.  CBG:  Recent Labs Lab 02/26/15 1114 02/26/15 1655 02/26/15 2104 02/27/15 0614 02/27/15 1132  GLUCAP 141* 155* 176* 152* 159*    No results found for this or any previous visit (from the past 240 hour(s)).   Studies: US Abdomen Limited  02/27/2015   CLINICAL DATA:  Ascites.  EXAM: LIMITED ABDOMINAL ULTRASOUND  COMPARISON:  02/10/2015.  FINDINGS: No ascites noted on today's exam.  No paracentesis performed.  IMPRESSION: No ascites noted on today's exam.   Electronically Signed   By: Maisie Fus  Register   On: 02/27/2015 10:51    Scheduled Meds: . folic acid  1 mg Oral Daily  . furosemide  20 mg Oral Daily  . insulin aspart  0-15 Units Subcutaneous TID WC  . insulin aspart  0-5 Units Subcutaneous QHS  . lactulose  30 g Oral 6 times per day  . rifaximin  550 mg Oral BID  . sertraline  25 mg Oral Daily  . sodium chloride  3 mL Intravenous Q12H  . spironolactone  25 mg Oral Daily  . thiamine  100 mg Oral Daily   Continuous Infusions:   Time spent: 35 minutes  Christiane Ha  [facility] Pager [phone]. If 7PM-7AM, please contact night-coverage at [email or web address], password [email or web address] 02/27/2015, 3:39 PM  LOS: 2 days

## 2015-02-27 NOTE — Progress Notes (Addendum)
[facility] Progress Note  Subjective: Feeling a little better today little more clear headed. Complaining of some abdominal distention  Objective: Vital signs in last 24 hours: Temp:  [98 F (36.7 C)-98.5 F (36.9 C)] 98.2 F (36.8 C) (06/23 0925) Pulse Rate:  [94-107] 107 (06/23 0925) Resp:  [18-20] 18 (06/23 0925) BP: (132-139)/(67-78) 138/77 mmHg (06/23 0925) SpO2:  [99 %-100 %] 100 % (06/23 0925) Weight:  [91.672 kg (202 lb 1.6 oz)] 91.672 kg (202 lb 1.6 oz) (06/23 0543) Weight change: -1.996 kg (-4 lb 6.4 oz)   PE: Abdomen somewhat taut minimally tender Lab Results: Results for orders placed or performed during the hospital encounter of 02/25/15 (from the past 24 hour(s))  Glucose, capillary     Status: Abnormal   Collection Time: 02/26/15 11:14 AM  Result Value Ref Range   Glucose-Capillary 141 (H) 65 - 99 mg/dL   Comment 1 Notify RN    Comment 2 Document in Chart   Glucose, capillary     Status: Abnormal   Collection Time: 02/26/15  4:55 PM  Result Value Ref Range   Glucose-Capillary 155 (H) 65 - 99 mg/dL   Comment 1 Notify RN    Comment 2 Document in Chart   Glucose, capillary     Status: Abnormal   Collection Time: 02/26/15  9:04 PM  Result Value Ref Range   Glucose-Capillary 176 (H) 65 - 99 mg/dL   Comment 1 Notify RN    Comment 2 Document in Chart   Basic metabolic panel     Status: Abnormal   Collection Time: 02/27/15  3:49 AM  Result Value Ref Range   Sodium 137 135 - 145 mmol/L   Potassium 4.5 3.5 - 5.1 mmol/L   Chloride 112 (H) 101 - 111 mmol/L   CO2 18 (L) 22 - 32 mmol/L   Glucose, Bld 143 (H) 65 - 99 mg/dL   BUN 20 6 - 20 mg/dL   Creatinine, Ser 3.41 (H) 0.61 - 1.24 mg/dL   Calcium 9.4 8.9 - 93.7 mg/dL   GFR calc non Af Amer 48 (L) >60 mL/min   GFR calc Af Amer 56 (L) >60 mL/min   Anion gap 7 5 - 15  Glucose, capillary     Status: Abnormal   Collection Time: 02/27/15  6:14 AM  Result Value Ref Range   Glucose-Capillary 152 (H) 65 -  99 mg/dL   Comment 1 Notify RN    Comment 2 Document in Chart     Studies/Results: No results found.    Assessment: Hepatic encephalopathy suboptimally dosed with lactulose based on stool frequency Possible ascites and SBP although none seen on previous ultrasound 3 weeks ago  Plan: Continue lactulose and titrate to 3-4 loose stools a day Continues on rifaximin Results of today's ultrasound and possible paracentesis Given renal insufficiency, if no ascites probably discontinue spironolactone     Shley Dolby C 02/27/2015, 9:28 AM  Pager [phone] If no answer or after 5 PM call [phone]

## 2015-02-28 DIAGNOSIS — E119 Type 2 diabetes mellitus without complications: Secondary | ICD-10-CM

## 2015-02-28 LAB — BASIC METABOLIC PANEL
Anion gap: 12 (ref 5–15)
BUN: 20 mg/dL (ref 6–20)
CO2: 18 mmol/L — ABNORMAL LOW (ref 22–32)
Calcium: 9.8 mg/dL (ref 8.9–10.3)
Chloride: 109 mmol/L (ref 101–111)
Creatinine, Ser: 1.42 mg/dL — ABNORMAL HIGH (ref 0.61–1.24)
GFR calc Af Amer: 59 mL/min — ABNORMAL LOW (ref >60)
GFR calc non Af Amer: 51 mL/min — ABNORMAL LOW (ref >60)
Glucose, Bld: 182 mg/dL — ABNORMAL HIGH (ref 65–99)
POTASSIUM: 4.2 mmol/L (ref 3.5–5.1)
Sodium: 139 mmol/L (ref 135–145)

## 2015-02-28 LAB — GLUCOSE, CAPILLARY
GLUCOSE-CAPILLARY: 158 mg/dL — AB (ref 65–99)
GLUCOSE-CAPILLARY: 143 mg/dL — AB (ref 65–99)
Glucose-Capillary: 199 mg/dL — ABNORMAL HIGH (ref 65–99)
Glucose-Capillary: 172 mg/dL — ABNORMAL HIGH (ref 65–99)

## 2015-02-28 LAB — CBC
HCT: 30.8 % — ABNORMAL LOW (ref 39.0–52.0)
Hemoglobin: 10.5 g/dL — ABNORMAL LOW (ref 13.0–17.0)
MCH: 31.9 pg (ref 26.0–34.0)
MCHC: 34.1 g/dL (ref 30.0–36.0)
MCV: 93.6 fL (ref 78.0–100.0)
PLATELETS: 108 10*3/uL — AB (ref 150–400)
RBC: 3.29 MIL/uL — ABNORMAL LOW (ref 4.22–5.81)
RDW: 14.4 % (ref 11.5–15.5)
WBC: 6.7 10*3/uL (ref 4.0–10.5)

## 2015-02-28 LAB — AMMONIA: Ammonia: 134 umol/L — ABNORMAL HIGH (ref 9–35)

## 2015-02-28 MED ORDER — MAGNESIUM CITRATE PO SOLN
1.0000 | Freq: Once | ORAL | Status: AC
  Administered 2015-02-28: 1 via ORAL
  Filled 2015-02-28: qty 296

## 2015-02-28 NOTE — Evaluation (Signed)
Physical Therapy Evaluation Patient Details Name: Jermaine Alexander MRN: [id number] DOB: 25-Nov-1950 Today's Date: 02/28/2015   History of Present Illness  64 y.o. 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 Admited with encephalopathy and elevated ammonia  Clinical Impression  Pt with flat affect, decreased gait and balance with report of several falls recently. Pt with good bil LE strength and decreased mobility at baseline with assist from family for all cooking and homemaking. Pt will benefit from acute therapy to maximize balance, gait and safety to decrease burden of care and fall risk.     Follow Up Recommendations Home health PT;Supervision for mobility/OOB    Equipment Recommendations  Rolling walker with 5" wheels    Recommendations for Other Services       Precautions / Restrictions Precautions Precautions: Fall Precaution Comments: reports 2 falls within past 2 weeks Restrictions Weight Bearing Restrictions: No      Mobility  Bed Mobility Overal bed mobility: Needs Assistance Bed Mobility: Supine to Sit     Supine to sit: Supervision     General bed mobility comments: on BSC on arrival  Transfers Overall transfer level: Needs assistance Equipment used: Rolling walker (2 wheeled) Transfers: Sit to/from Stand Sit to Stand: Min guard Stand pivot transfers: Min guard       General transfer comment: cues for hand placement and safety with stand from [facility] and recliner  Ambulation/Gait Ambulation/Gait assistance: Min guard Ambulation Distance (Feet): 150 Feet Assistive device: Rolling walker (2 wheeled);Quad cane Gait Pattern/deviations: Step-through pattern;Decreased stride length   Gait velocity interpretation: Below normal speed for age/gender General Gait Details: pt with slow gait with slight unsteadiness with initial use of quad cane 50', pt stated he  felt better with prior trial of RW and with RW pt walked 100' without significant change to gait or speed  Stairs            Wheelchair Mobility    Modified Rankin (Stroke Patients Only)       Balance Overall balance assessment: Needs assistance   Sitting balance-Leahy Scale: Good     Standing balance support: During functional activity;Single extremity supported Standing balance-Leahy Scale: Fair   Single Leg Stance - Right Leg: 0 Single Leg Stance - Left Leg: 0       Rhomberg - Eyes Closed: 10                 Pertinent Vitals/Pain Pain Assessment: No/denies pain Faces Pain Scale: Hurts little more Pain Location: back Pain Descriptors / Indicators: Aching Pain Intervention(s): Limited activity within patient's tolerance;Repositioned;Heat applied    Home Living Family/patient expects to be discharged to:: Private residence Living Arrangements: Children Available Help at Discharge: Family;Available 24 hours/day Type of Home: Apartment Home Access: Level entry     Home Layout: Two level;Bed/bath upstairs Home Equipment: Cane - quad      Prior Function Level of Independence: Independent with assistive device(s)         Comments: Uses cane at times     Hand Dominance   Dominant Hand: Right    Extremity/Trunk Assessment   Upper Extremity Assessment: Defer to OT evaluation RUE Deficits / Details: AROM WFL with the exception thumb finger opposition. slow rigid tyupe movements     LUE Deficits / Details: initially shoulder FF to 90. after PROM, AROM WFL. limited fine motor; slow rigid type movements   Lower Extremity Assessment: Overall WFL for tasks  assessed      Cervical / Trunk Assessment: Normal  Communication   Communication: No difficulties  Cognition Arousal/Alertness: Awake/alert Behavior During Therapy: Flat affect Overall Cognitive Status: No family/caregiver present to determine baseline cognitive functioning                       General Comments      Exercises        Assessment/Plan    PT Assessment Patient needs continued PT services  PT Diagnosis Abnormality of gait   PT Problem List Decreased activity tolerance;Decreased balance;Decreased mobility;Decreased knowledge of use of DME;Decreased safety awareness  PT Treatment Interventions DME instruction;Gait training;Functional mobility training;Therapeutic activities;Therapeutic exercise;Balance training;Patient/family education;Stair training   PT Goals (Current goals can be found in the Care Plan section) Acute Rehab PT Goals Patient Stated Goal: return home PT Goal Formulation: With patient Time For Goal Achievement: 03/14/15 Potential to Achieve Goals: Good    Frequency Min 3X/week   Barriers to discharge   pt reports family can assist 24hrs    Co-evaluation               End of Session Equipment Utilized During Treatment: Gait belt Activity Tolerance: Patient tolerated treatment well;No increased pain Patient left: Other (comment);with call bell/phone within reach (return to [facility] with call bell and tech notified) Nurse Communication: Mobility status         Time: 4034-7425 PT Time Calculation (min) (ACUTE ONLY): 20 min   Charges:   PT Evaluation $Initial PT Evaluation Tier I: 1 Procedure     PT G CodesDelorse Lek 02/28/2015, 1:28 PM Delaney Meigs, PT [phone]

## 2015-02-28 NOTE — Care Management Note (Signed)
Case Management Note  Patient Details  Name: Jermaine Alexander MRN: [id number] Date of Birth: 12-31-1950  Subjective/Objective:      Admitted with Hepatic Encephalopathy              Action/Plan: Patient stays with his daughter, has private insurance with [employer] and stated that he does not have any problem getting his medication; Pharmacy of choice is [facility], PCP is with [facility]. HHC choices offered patient chose [facility]; Farrah with [facility] called for arrangements; DME ordered - rolling walker and 3:1.  Expected Discharge Date:   possibly 03/01/2015               Expected Discharge Plan:  Home w Home Health Services  Discharge planning Services  CM Consult   Choice offered to:  Patient  DME Arranged:  3-N-1, Walker rolling DME Agency:  [facility]  HH Arranged:  RN, PT, OT, Nurse's Aide HH Agency:  [facility]  Status of Service:  In process, will continue to follow  Medicare Important Message Given:  Yes Date Medicare IM Given:  02/28/15 Medicare IM give by:  Abelino Derrick RN  Cherrie Distance, RN,BSN,MHA [phone] 02/28/2015, 1:56 PM

## 2015-02-28 NOTE — Progress Notes (Signed)
[facility] PROGRESS NOTE  Jermaine Alexander ZOX:[id number] DOB: 1951-04-13 DOA: 02/25/2015 PCP: [name], MD  Assessment/Plan:  Principal Problem:   Hepatic encephalopathy: less lethargic today off gabapentin and ultram.  NH3 still high. Mag citrate given Active Problems:   Anemia   Cirrhosis: no significant ascites to tap. Still with edema. Continue diuretics and monitor lytes   AKI (acute kidney injury) improving. Lisinopril held   Thrombocytopenia   Type 2 diabetes mellitus: SSI for now  HPI/Subjective: Had BM after mag citrate  Objective: Filed Vitals:   02/28/15 1342  BP: 151/69  Pulse: 112  Temp: 97.9 F (36.6 C)  Resp: 20    Intake/Output Summary (Last 24 hours) at 02/28/15 1354 Last data filed at 02/28/15 1318  Gross per 24 hour  Intake   1540 ml  Output    750 ml  Net    790 ml   Filed Weights   02/26/15 0100 02/27/15 0543 02/28/15 0512  Weight: 93.668 kg (206 lb 8 oz) 91.672 kg (202 lb 1.6 oz) 90.22 kg (198 lb 14.4 oz)    Exam:   General:  In chair. More alert  Cardiovascular: RRR without MGR  Respiratory: CTA without WRR  Abdomen: S, distended, nt. BS present  Ext: no CCE  Basic Metabolic Panel:  Recent Labs Lab 02/25/15 2058 02/26/15 0409 02/27/15 0349 02/28/15 0404  NA 139 139 137 139  K 4.4 4.3 4.5 4.2  CL 112* 112* 112* 109  CO2 16* 17* 18* 18*  GLUCOSE 172* 169* 143* 182*  BUN 27* 22* 20 20  CREATININE 1.97* 1.73* 1.49* 1.42*  CALCIUM 9.5 9.6 9.4 9.8   Liver Function Tests:  Recent Labs Lab 02/25/15 2058 02/26/15 0409  AST 56* 51*  ALT 34 32  ALKPHOS 262* 237*  BILITOT 1.1 1.1  PROT 7.7 7.3  ALBUMIN 2.9* 2.7*   No results for input(s): LIPASE, AMYLASE in the last 168 hours.  Recent Labs Lab 02/25/15 2058 02/28/15 0409  AMMONIA 138* 134*   CBC:  Recent Labs Lab 02/25/15 2058 02/26/15 0409 02/28/15 0404  WBC 6.0 5.3 6.7  NEUTROABS 2.6  --   --   HGB 10.1* 9.9* 10.5*  HCT 28.9* 28.7* 30.8*  MCV  92.6 93.5 93.6  PLT 103* 101* 108*   Cardiac Enzymes: No results for input(s): CKTOTAL, CKMB, CKMBINDEX, TROPONINI in the last 168 hours. BNP (last 3 results) No results for input(s): BNP in the last 8760 hours.  ProBNP (last 3 results) No results for input(s): PROBNP in the last 8760 hours.  CBG:  Recent Labs Lab 02/27/15 1132 02/27/15 1704 02/27/15 2151 02/28/15 0549 02/28/15 1133  GLUCAP 159* 136* 175* 158* 199*    No results found for this or any previous visit (from the past 240 hour(s)).   Studies: US Abdomen Limited  02/27/2015   CLINICAL DATA:  Ascites.  EXAM: LIMITED ABDOMINAL ULTRASOUND  COMPARISON:  02/10/2015.  FINDINGS: No ascites noted on today's exam.  No paracentesis performed.  IMPRESSION: No ascites noted on today's exam.   Electronically Signed   By: Maisie Fus  Register   On: 02/27/2015 10:51    Scheduled Meds: . folic acid  1 mg Oral Daily  . furosemide  20 mg Oral Daily  . insulin aspart  0-15 Units Subcutaneous TID WC  . insulin aspart  0-5 Units Subcutaneous QHS  . lactulose  30 g Oral 6 times per day  . rifaximin  550 mg Oral BID  . sertraline  25  mg Oral Daily  . sodium chloride  3 mL Intravenous Q12H  . spironolactone  25 mg Oral Daily  . thiamine  100 mg Oral Daily   Continuous Infusions:   Time spent: 15 minutes  Jermaine Alexander L  [facility]  [email or web address], password [facility] 02/28/2015, 1:54 PM  LOS: 3 days

## 2015-02-28 NOTE — Progress Notes (Signed)
[facility] Progress Note  Subjective: Complaining of being bloated and not having bowel movements  Objective: Vital signs in last 24 hours: Temp:  [97.9 F (36.6 C)-98.6 F (37 C)] 97.9 F (36.6 C) (06/24 0512) Pulse Rate:  [105-115] 115 (06/24 0512) Resp:  [18-20] 20 (06/24 0512) BP: (140-151)/(63-73) 151/72 mmHg (06/24 0512) SpO2:  [100 %] 100 % (06/24 0512) Weight:  [90.22 kg (198 lb 14.4 oz)] 90.22 kg (198 lb 14.4 oz) (06/24 0512) Weight change: -1.452 kg (-3 lb 3.2 oz)   PE: Relatively alert oriented, minimal pedal edema. Abdomen is somewhat distended  Lab Results: Results for orders placed or performed during the hospital encounter of 02/25/15 (from the past 24 hour(s))  Glucose, capillary     Status: Abnormal   Collection Time: 02/27/15 11:32 AM  Result Value Ref Range   Glucose-Capillary 159 (H) 65 - 99 mg/dL  Glucose, capillary     Status: Abnormal   Collection Time: 02/27/15  5:04 PM  Result Value Ref Range   Glucose-Capillary 136 (H) 65 - 99 mg/dL  Glucose, capillary     Status: Abnormal   Collection Time: 02/27/15  9:51 PM  Result Value Ref Range   Glucose-Capillary 175 (H) 65 - 99 mg/dL  Basic metabolic panel     Status: Abnormal   Collection Time: 02/28/15  4:04 AM  Result Value Ref Range   Sodium 139 135 - 145 mmol/L   Potassium 4.2 3.5 - 5.1 mmol/L   Chloride 109 101 - 111 mmol/L   CO2 18 (L) 22 - 32 mmol/L   Glucose, Bld 182 (H) 65 - 99 mg/dL   BUN 20 6 - 20 mg/dL   Creatinine, Ser 1.61 (H) 0.61 - 1.24 mg/dL   Calcium 9.8 8.9 - 09.6 mg/dL   GFR calc non Af Amer 51 (L) >60 mL/min   GFR calc Af Amer 59 (L) >60 mL/min   Anion gap 12 5 - 15  CBC     Status: Abnormal   Collection Time: 02/28/15  4:04 AM  Result Value Ref Range   WBC 6.7 4.0 - 10.5 K/uL   RBC 3.29 (L) 4.22 - 5.81 MIL/uL   Hemoglobin 10.5 (L) 13.0 - 17.0 g/dL   HCT 04.5 (L) 40.9 - 81.1 %   MCV 93.6 78.0 - 100.0 fL   MCH 31.9 26.0 - 34.0 pg   MCHC 34.1 30.0 - 36.0 g/dL   RDW 91.4 78.2 - 95.6 %   Platelets 108 (L) 150 - 400 K/uL  Ammonia     Status: Abnormal   Collection Time: 02/28/15  4:09 AM  Result Value Ref Range   Ammonia 134 (H) 9 - 35 umol/L  Glucose, capillary     Status: Abnormal   Collection Time: 02/28/15  5:49 AM  Result Value Ref Range   Glucose-Capillary 158 (H) 65 - 99 mg/dL   Comment 1 Notify RN    Comment 2 Document in Chart     Studies/Results: US Abdomen Limited  02/27/2015   CLINICAL DATA:  Ascites.  EXAM: LIMITED ABDOMINAL ULTRASOUND  COMPARISON:  02/10/2015.  FINDINGS: No ascites noted on today's exam.  No paracentesis performed.  IMPRESSION: No ascites noted on today's exam.   Electronically Signed   By: Maisie Fus  Register   On: 02/27/2015 10:51      Assessment: Hepatic encephalopathy no frequent stools, despite significant doses of lactulose  No ascites on ultrasound Plan: Will give 1 dose of magnesium citrate to try to alleviate  apparent constipation, continue lactulose and side effects and at current doses. Consider discontinuation of diarrhea at X if no indication other than ascites    Jermaine Alexander C 02/28/2015, 9:33 AM  Pager [phone] If no answer or after 5 PM call [phone]

## 2015-02-28 NOTE — Progress Notes (Signed)
Occupational Therapy Evaluation Patient Details Name: Jermaine Alexander MRN: [id number] DOB: 1950/09/12 Today's Date: 02/28/2015    History of Present Illness 64 y.o. 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 Admitted with complaints of dizziness as well as generalized weakness with increased confusion.   Clinical Impression   PTA, pt mod I with ADL and mobility. Pt presents with functional deficits listed below and will benefit from skilled OT services to maximize independence and safety with ADL to facilitate D/C home with HHOT and 24/7 S initially. Pt states that he has talked with the housing authority about moving into a 1 story apt. Pt's daughter plans to provide 24/7 S after D/C. Will follow acutely.    Follow Up Recommendations  Home health OT;Supervision/Assistance - 24 hour    Equipment Recommendations  3 in 1 bedside comode;Tub/shower bench    Recommendations for Other Services       Precautions / Restrictions Precautions Precautions: Fall Precaution Comments: reports 2 falls within past 2 weeks Restrictions Weight Bearing Restrictions: No      Mobility Bed Mobility Overal bed mobility: Needs Assistance Bed Mobility: Supine to Sit     Supine to sit: Supervision        Transfers Overall transfer level: Needs assistance Equipment used: Rolling walker (2 wheeled) Transfers: Sit to/from [employer] Sit to Stand: Min guard Stand pivot transfers: Min guard       General transfer comment: vc for hand placement; positioning of self in RW; orientation of RW when ambulating    Balance Overall balance assessment: Needs assistance   Sitting balance-Leahy Scale: Good     Standing balance support: During functional activity;Single extremity supported Standing balance-Leahy Scale: Fair                              ADL Overall  ADL's : Needs assistance/impaired Eating/Feeding: Set up Eating/Feeding Details (indicate cue type and reason): nsg reports pt with difficulty holding utensils and bring cup to mouth. Pt seen at lunch and able to manipulate utensils. slow movements Grooming: Set up;Supervision/safety;Standing Grooming Details (indicate cue type and reason): cues for problem solving/safety Upper Body Bathing: Minimal assitance;Sitting   Lower Body Bathing: Moderate assistance;Sit to/from stand   Upper Body Dressing : Minimal assistance;Sitting   Lower Body Dressing: Moderate assistance;Sit to/from stand   Toilet Transfer: Minimal assistance;Ambulation;BSC   Toileting- Clothing Manipulation and Hygiene: Minimal assistance;Sit to/from stand       Functional mobility during ADLs: Minimal assistance;Rolling walker;Cueing for safety;Cueing for sequencing (pt sunning into objects with RW at times) General ADL Comments: Affected by cognition, weakness adn decreased fine motor/coordination. May benefit from tubing for utensils.      Vision Vision Assessment?: Vision impaired- to be further tested in functional context Additional Comments: recommend testing fields   Perception     Praxis      Pertinent Vitals/Pain Pain Assessment: Faces Faces Pain Scale: Hurts little more Pain Location: back Pain Descriptors / Indicators: Aching Pain Intervention(s): Limited activity within patient's tolerance;Repositioned;Heat applied     Hand Dominance Right   Extremity/Trunk Assessment Upper Extremity Assessment Upper Extremity Assessment: RUE deficits/detail;LUE deficits/detail RUE Deficits / Details: AROM WFL with the exception thumb finger opposition. slow rigid tyupe movements RUE Coordination: decreased fine motor;decreased gross motor (difficulty with pushing buttons on phone) LUE Deficits / Details: initially shoulder FF to 90. after PROM, AROM  WFL. limited fine motor; slow rigid type movements LUE  Coordination: decreased fine motor;decreased gross motor   Lower Extremity Assessment Lower Extremity Assessment: Generalized weakness;Defer to PT evaluation   Cervical / Trunk Assessment Cervical / Trunk Assessment: Normal   Communication Communication Communication: No difficulties   Cognition Arousal/Alertness: Awake/alert Behavior During Therapy: Flat affect Overall Cognitive Status: No family/caregiver present to determine baseline cognitive functioning (appears impaired)                     General Comments       Exercises       Shoulder Instructions      Home Living Family/patient expects to be discharged to:: Private residence Living Arrangements: Children Available Help at Discharge: Family;Available 24 hours/day Type of Home: Apartment Home Access: Level entry     Home Layout: Two level;Bed/bath upstairs Alternate Level Stairs-Number of Steps: 1 flight   Bathroom Shower/Tub: Chief Strategy Officer: Standard Bathroom Accessibility: Yes How Accessible: Accessible via walker Home Equipment: Cane - single point          Prior Functioning/Environment Level of Independence: Independent with assistive device(s)        Comments: Uses cane at times    OT Diagnosis: Generalized weakness;Cognitive deficits;Acute pain;Blindness and low vision   OT Problem List: Decreased strength;Decreased activity tolerance;Impaired balance (sitting and/or standing);Impaired vision/perception;Decreased coordination;Decreased cognition;Decreased safety awareness;Decreased knowledge of use of DME or AE;Pain   OT Treatment/Interventions:      OT Goals(Current goals can be found in the care plan section) Acute Rehab OT Goals Patient Stated Goal: return home OT Goal Formulation: With patient Time For Goal Achievement: 03/14/15 Potential to Achieve Goals: Good ADL Goals Pt Will Perform Lower Body Bathing: with supervision;sit to/from stand Pt Will Perform  Lower Body Dressing: with supervision;sit to/from stand Pt Will Transfer to Toilet: with supervision;ambulating;bedside commode Pt Will Perform Toileting - Clothing Manipulation and hygiene: with modified independence;sit to/from stand Pt/caregiver will Perform Home Exercise Program: Increased strength;Both right and left upper extremity;With theraputty;With written HEP provided (and fine motor/coordination HEP) Additional ADL Goal #1: Demonstrate anticipatory awareness during ADL task with min vc  OT Frequency: Min 2X/week   Barriers to D/C:            Co-evaluation              End of Session Equipment Utilized During Treatment: Gait belt;Rolling walker Nurse Communication: Mobility status  Activity Tolerance: Patient tolerated treatment well Patient left: in chair;with call bell/phone within reach;with chair alarm set   Time: 1135-1205 OT Time Calculation (min): 30 min Charges:  OT General Charges $OT Visit: 1 Procedure OT Evaluation $Initial OT Evaluation Tier I: 1 Procedure OT Treatments $Self Care/Home Management : 8-22 mins G-Codes:    Jermaine Alexander,HILLARY 03/02/2015, 1:17 PM   [facility], OTR/L  [phone] March 02, 2015

## 2015-03-01 LAB — GLUCOSE, CAPILLARY
GLUCOSE-CAPILLARY: 157 mg/dL — AB (ref 65–99)
Glucose-Capillary: 128 mg/dL — ABNORMAL HIGH (ref 65–99)

## 2015-03-01 MED ORDER — SPIRONOLACTONE 25 MG PO TABS: 25.0000 mg | ORAL_TABLET | Freq: Every day | ORAL | Status: AC

## 2015-03-01 MED ORDER — FUROSEMIDE 20 MG PO TABS: 20.0000 mg | ORAL_TABLET | Freq: Every day | ORAL | Status: AC

## 2015-03-01 NOTE — Progress Notes (Signed)
Pt discharge home, instructions given daughter at the bedside. IV removed.

## 2015-03-01 NOTE — Progress Notes (Signed)
[facility] PROGRESS NOTE Subjective patient apparently had voluminous stools since yesterday. According to the nursing staff is doing much better  Objective: Vital signs in last 24 hours: Temp:  [97.3 F (36.3 C)-98.3 F (36.8 C)] 98 F (36.7 C) (06/25 0536) Pulse Rate:  [100-112] 107 (06/25 0536) Resp:  [18-20] 18 (06/25 0536) BP: (139-163)/(67-78) 154/74 mmHg (06/25 0536) SpO2:  [100 %] 100 % (06/25 0536) Weight:  [89.931 kg (198 lb 4.2 oz)] 89.931 kg (198 lb 4.2 oz) (06/25 0536) Last BM Date: 02/27/15  Intake/Output from previous day: 06/24 0701 - 06/25 0700 In: 1640 [P.O.:1640] Out: 6228 [Urine:1225; Stool:5003] Intake/Output this shift:    PE: General-- sitting on a chair being given a bath. Heart-- Lungs-- Abdomen-- Neuro-- alert and oriented  Lab Results:  Recent Labs  02/28/15 0404  WBC 6.7  HGB 10.5*  HCT 30.8*  PLT 108*   BMET  Recent Labs  02/27/15 0349 02/28/15 0404  NA 137 139  K 4.5 4.2  CL 112* 109  CO2 18* 18*  CREATININE 1.49* 1.42*   LFT No results for input(s): PROT, AST, ALT, ALKPHOS, BILITOT, BILIDIR, IBILI in the last 72 hours. PT/INR No results for input(s): LABPROT, INR in the last 72 hours. PANCREAS No results for input(s): LIPASE in the last 72 hours.       Studies/Results: US Abdomen Limited  02/27/2015   CLINICAL DATA:  Ascites.  EXAM: LIMITED ABDOMINAL ULTRASOUND  COMPARISON:  02/10/2015.  FINDINGS: No ascites noted on today's exam.  No paracentesis performed.  IMPRESSION: No ascites noted on today's exam.   Electronically Signed   By: Maisie Fus  Register   On: 02/27/2015 10:51    Medications: I have reviewed the patient's current medications.  Assessment/Plan: 1. Cirrhosis with hepatic encephalopathy. Ultrasound showed no acidic fluid. Patient had excellent bowel movement and is currently on lactulose and rifaximin.  Recommendation: would continue on lactulose. Suggest 30 mL TID and rifaximin 550 mg BID.  Would have the patient follow-up in the office with Dr. Bosie Clos in 2 to 3 weeks. We will sign off please call us for any further problems.   Juanette Urizar JR,Tatiyana Foucher L 03/01/2015, 8:47 AM  Pager: [phone] If no answer or after hours call [phone]
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
## 2015-03-13 ENCOUNTER — Ambulatory Visit: Payer: Medicare HMO | Admitting: Podiatry

## 2017-05-21 IMAGING — US US ABDOMEN COMPLETE
1 series · 14 of 25 positions shown · non-contrast
Comparison: None.

CLINICAL DATA: Acute renal failure, cirrhosis

EXAM:
ULTRASOUND ABDOMEN COMPLETE

[Series 1: us abdomen complete · 0.22mm/px · 14 of 51 slices shown]
[im 1/51]
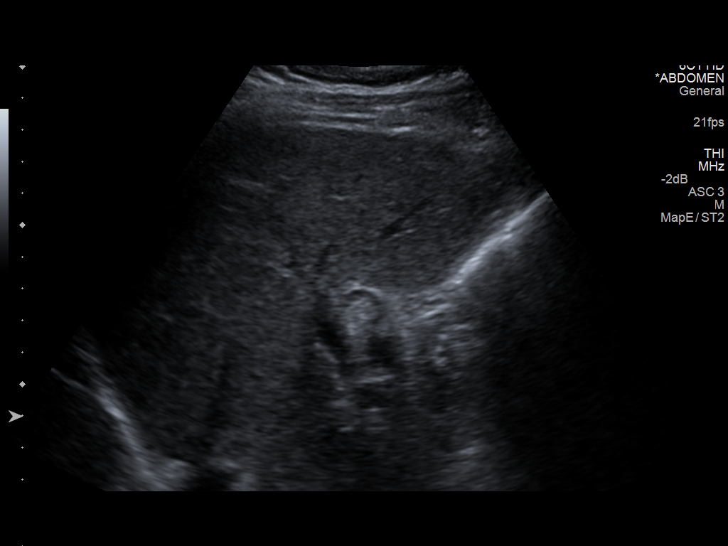
[im 5/51]
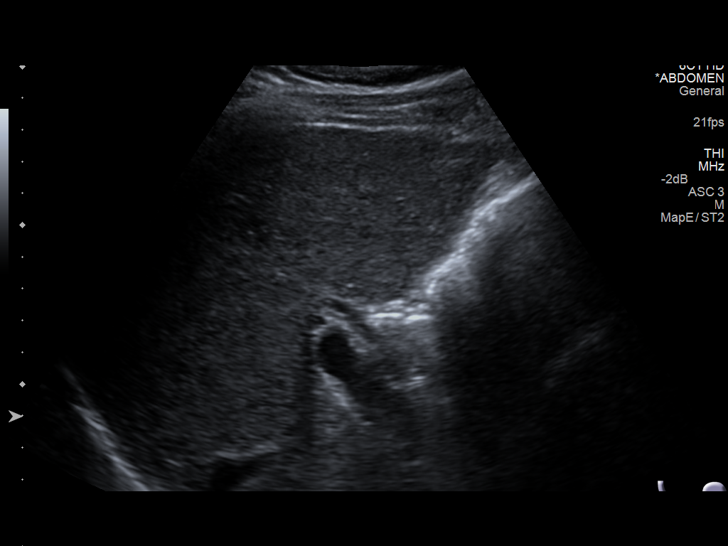
[im 9/51]
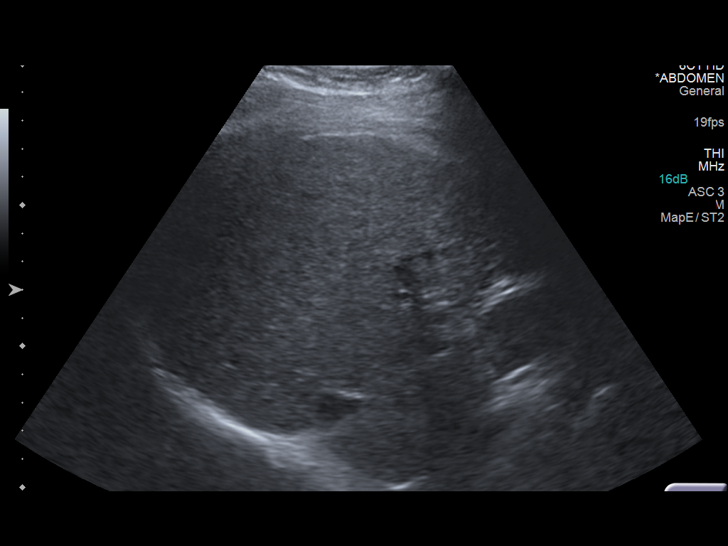
[im 13/51]
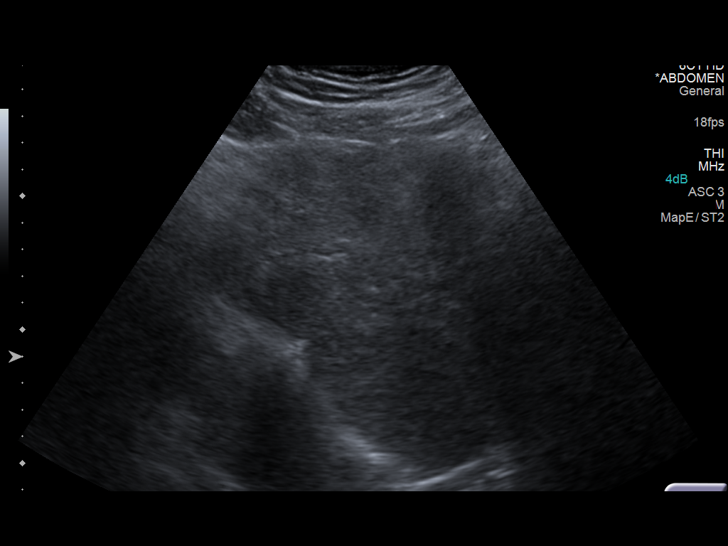
[im 17/51]
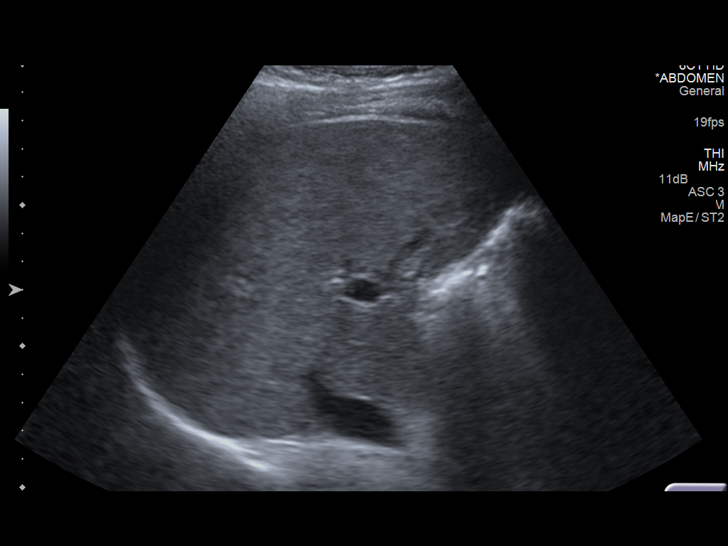
[im 19/51]
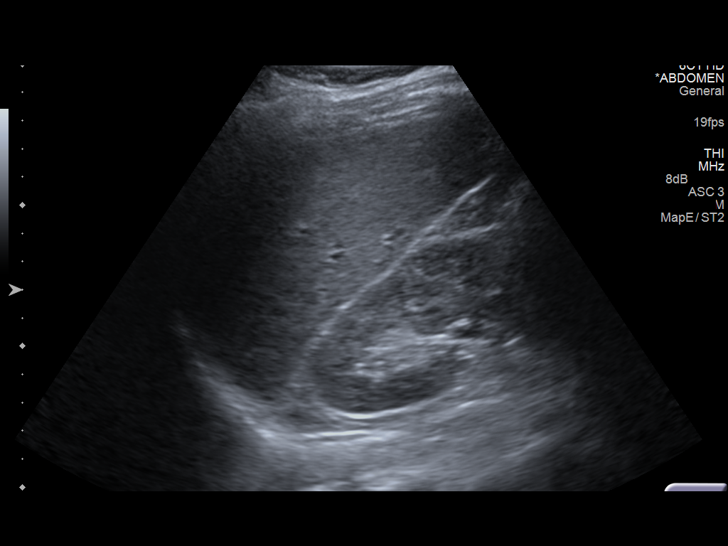
[im 23/51]
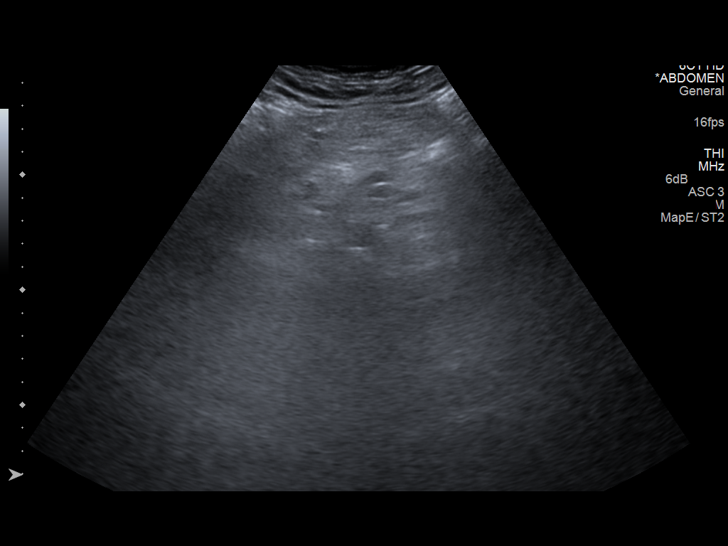
[im 28/51]
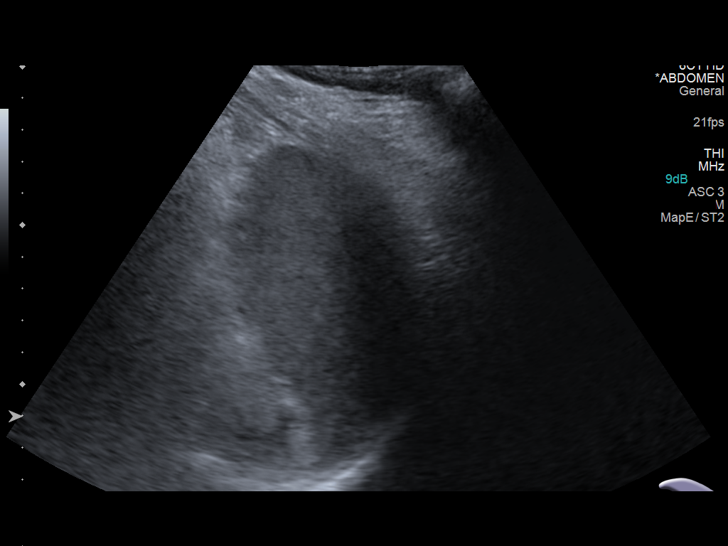
[im 32/51]
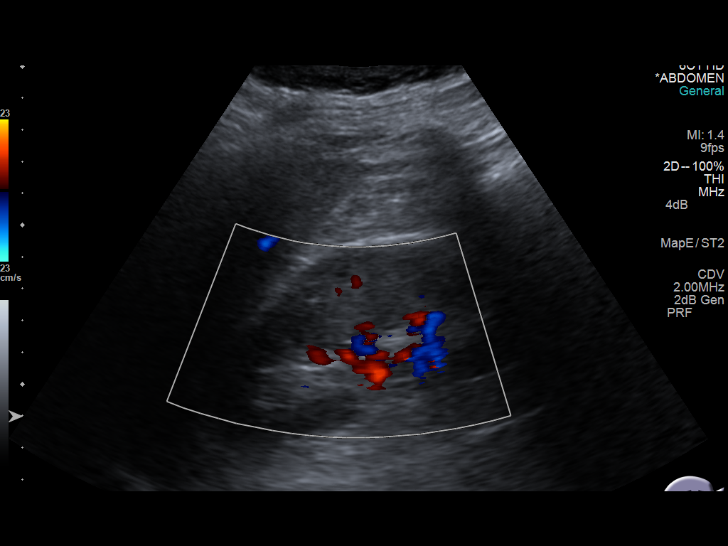
[im 34/51]
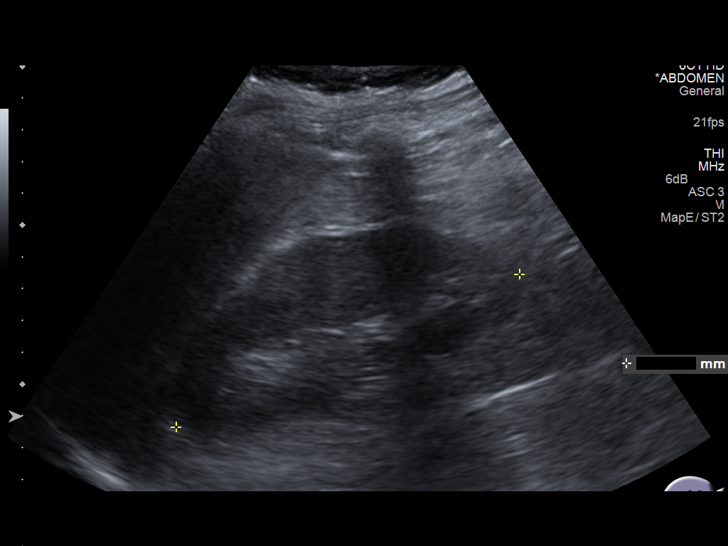
[im 38/51]
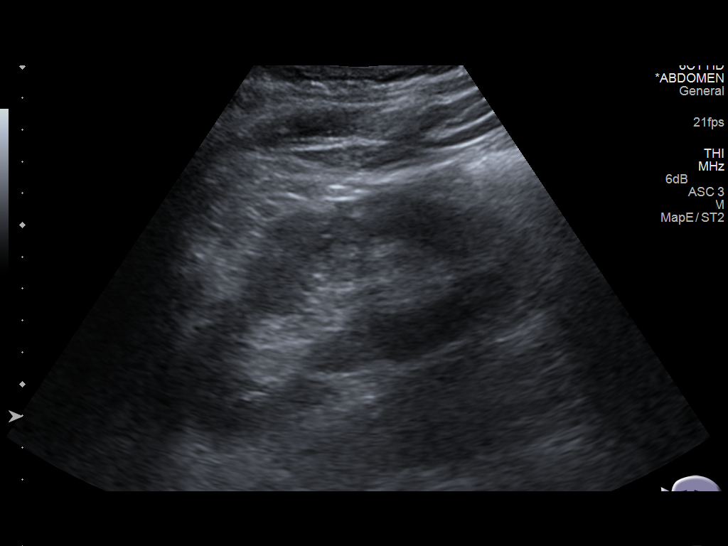
[im 42/51]
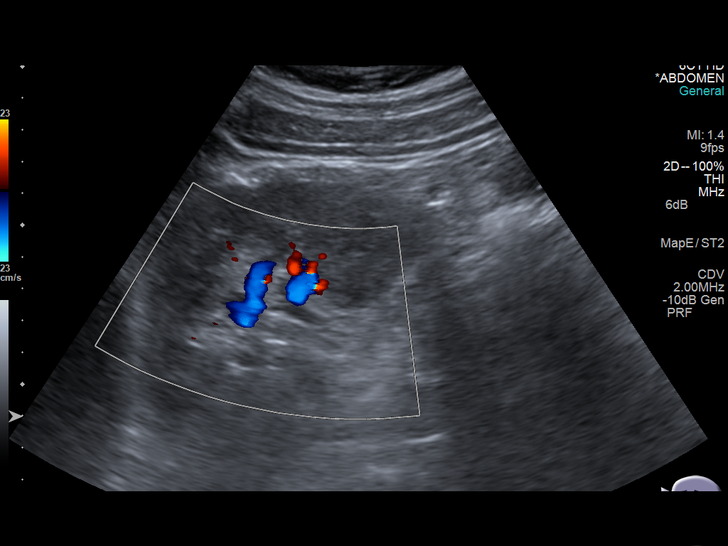
[im 46/51]
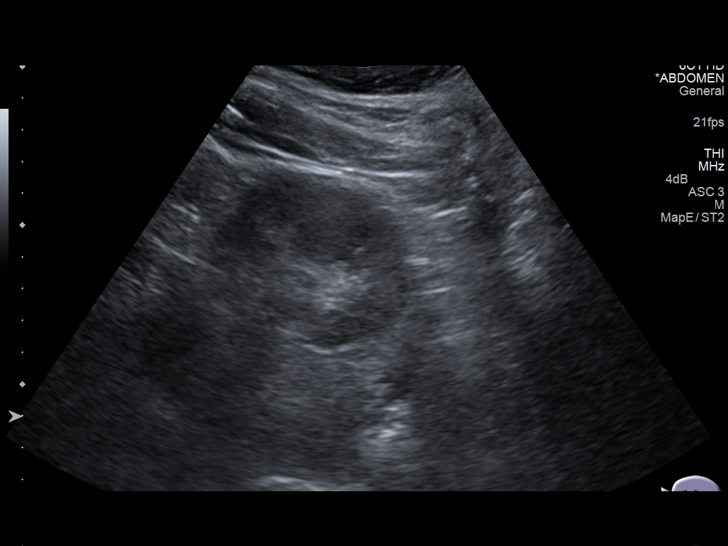
[im 51/51]
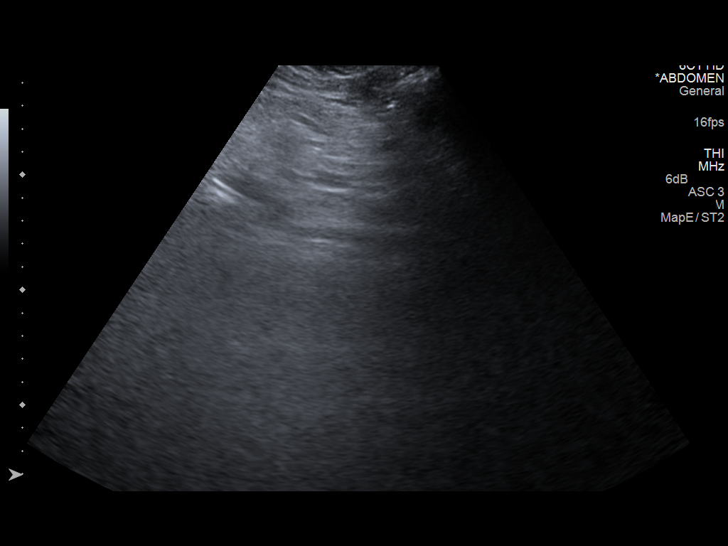

[14 of 25 positions shown; findings below may reference images not displayed]

FINDINGS: Gallbladder: Prior cholecystectomy

Common bile duct: Diameter: Normal caliber, 5 mm

Liver: Coarsened echotexture throughout the liver. Subtle nodular
contours in areas along the liver surface. No focal abnormality or
biliary duct dilatation.

IVC: Obscured by overlying bowel gas

Pancreas: Obscured by overlying bowel gas

Spleen: Size and appearance within normal limits.

Right Kidney: Length: 11.8 cm. Echogenicity within normal limits. No
mass or hydronephrosis visualized.

Left Kidney: Length: 12.4 cm. Echogenicity within normal limits. No
mass or hydronephrosis visualized.

Abdominal aorta: No aneurysm visualized.

Other findings: None.
IMPRESSION: Mildly coarsened liver echotexture with subtle nodularity in areas
along the liver surface suggesting given history of cirrhosis.

No hydronephrosis.

Prior cholecystectomy.

## 2017-06-05 IMAGING — CT CT HEAD W/O CM
1 of 4 series · 9 of 30 positions shown, 12 images · non-contrast
Comparison: None.

CLINICAL DATA: Found lethargic and unresponsive.

EXAM:
CT HEAD WITHOUT CONTRAST
TECHNIQUE: Contiguous axial images were obtained from the base of the skull
through the vertex without intravenous contrast.

[Series 2: head 5.0 h30s · axial · 0.44mm/px · z∈[-66,+59]mm · 9 of 33 slices shown, 12 images]
[im 4/33  brain]
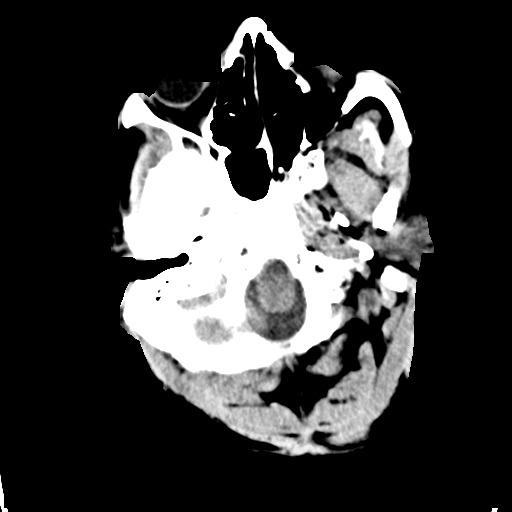
[im 4/33  bone]
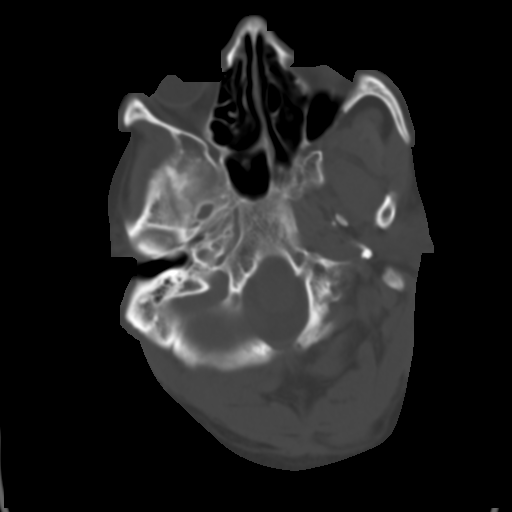
[im 7/33  brain]
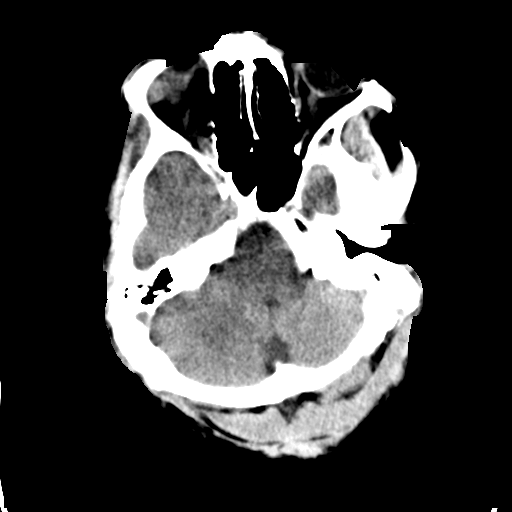
[im 10/33  brain]
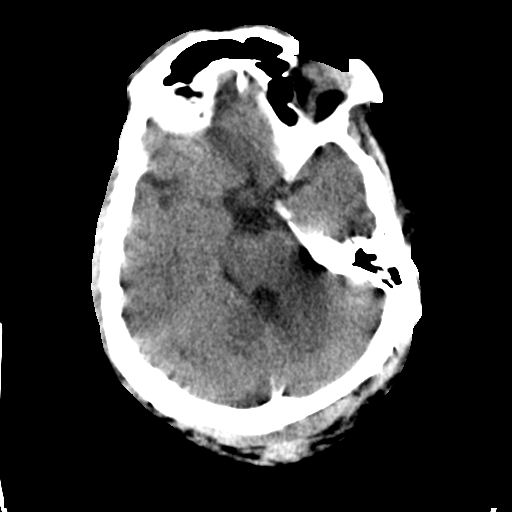
[im 13/33  brain]
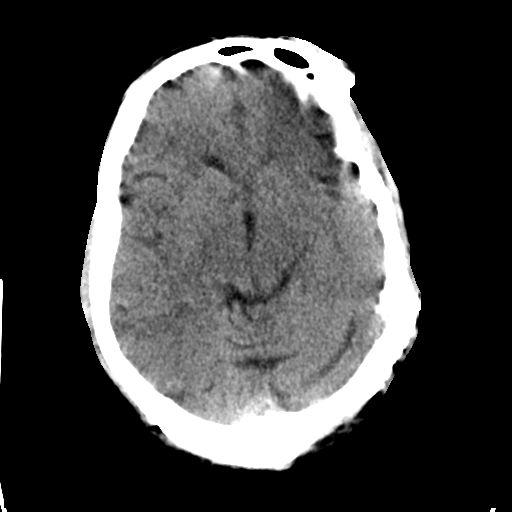
[im 17/33  brain]
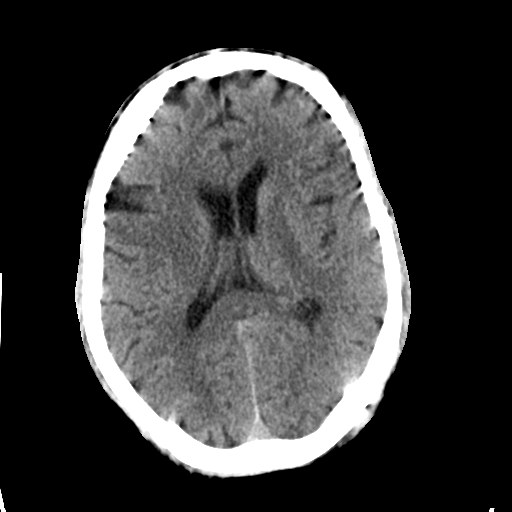
[im 17/33  bone]
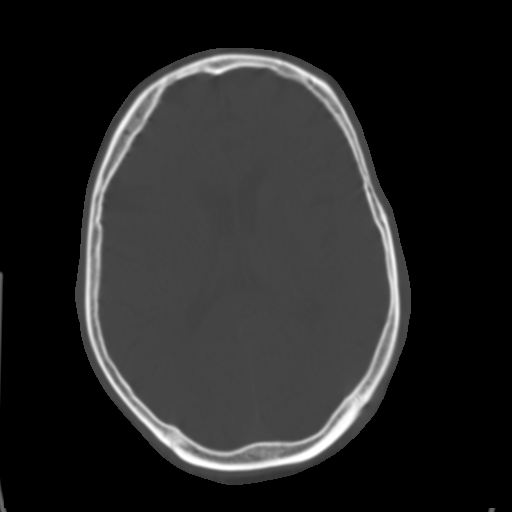
[im 20/33  brain]
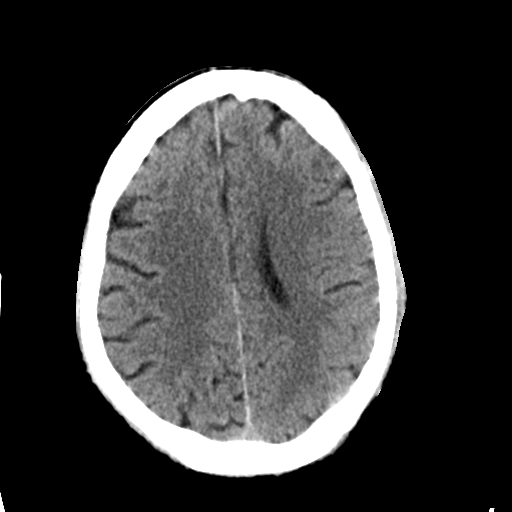
[im 23/33  brain]
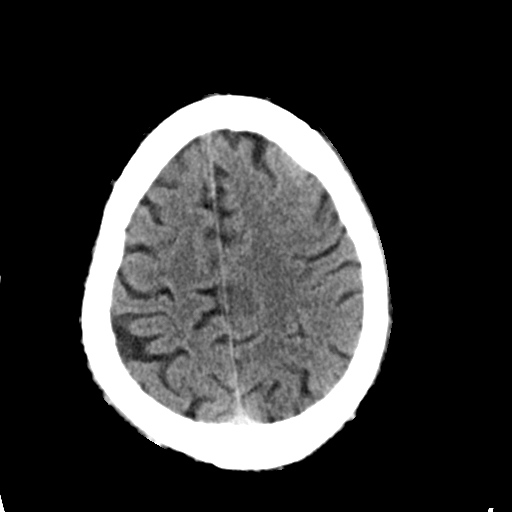
[im 26/33  brain]
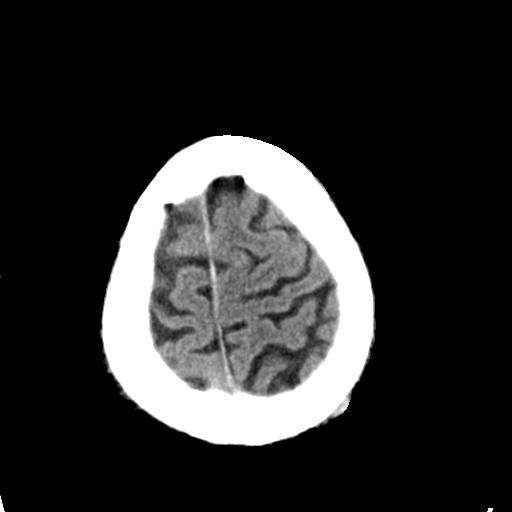
[im 29/33  brain]
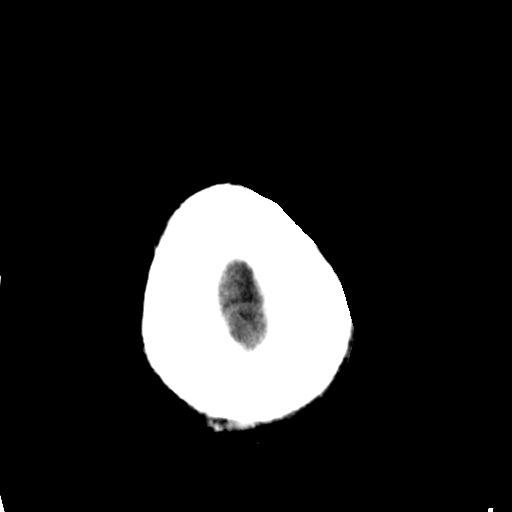
[im 29/33  bone]
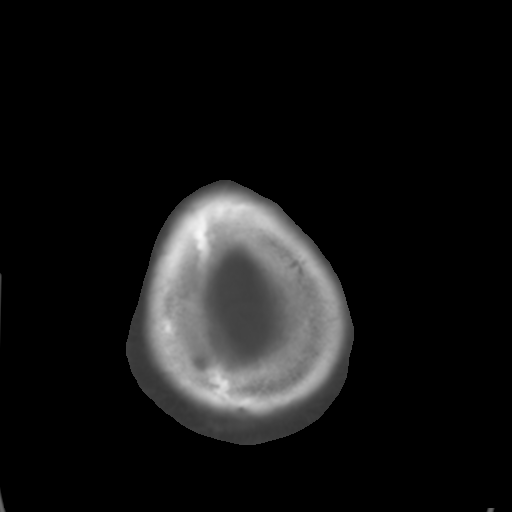

[9 of 30 positions shown; findings below may reference images not displayed]

FINDINGS: The patient was unable to remain motionless for the exam. Small or
subtle lesions could be overlooked.

No evidence for acute infarction, hemorrhage, mass lesion,
hydrocephalus, or extra-axial fluid. No atrophy or white matter
disease. Intact calvarium. No acute sinus or mastoid disease.
IMPRESSION: Motion degraded exam demonstrating no definite acute intracranial
abnormality
